# Patient Record
Sex: Female | Born: 1955 | Race: Black or African American | Hispanic: No | Smoking: Never smoker
Health system: Southern US, Community
[De-identification: ages and names within clinical notes are randomized; demographics above are authoritative.]

## PROBLEM LIST (undated history)

## (undated) DIAGNOSIS — E7800 Pure hypercholesterolemia, unspecified: Secondary | ICD-10-CM

## (undated) DIAGNOSIS — I10 Essential (primary) hypertension: Secondary | ICD-10-CM

## (undated) DIAGNOSIS — I639 Cerebral infarction, unspecified: Secondary | ICD-10-CM

## (undated) DIAGNOSIS — D649 Anemia, unspecified: Secondary | ICD-10-CM

## (undated) DIAGNOSIS — R0602 Shortness of breath: Secondary | ICD-10-CM

## (undated) DIAGNOSIS — D573 Sickle-cell trait: Secondary | ICD-10-CM

## (undated) DIAGNOSIS — M1990 Unspecified osteoarthritis, unspecified site: Secondary | ICD-10-CM

## (undated) HISTORY — PX: ANAL FISSURECTOMY: SUR608

---

## 2001-04-10 ENCOUNTER — Inpatient Hospital Stay (HOSPITAL_COMMUNITY): Admission: AD | Admit: 2001-04-10 | Discharge: 2001-04-10 | Payer: Self-pay | Admitting: Obstetrics

## 2001-09-16 ENCOUNTER — Emergency Department (HOSPITAL_COMMUNITY): Admission: EM | Admit: 2001-09-16 | Discharge: 2001-09-16 | Payer: Self-pay | Admitting: Emergency Medicine

## 2001-10-20 ENCOUNTER — Emergency Department (HOSPITAL_COMMUNITY): Admission: EM | Admit: 2001-10-20 | Discharge: 2001-10-20 | Payer: Self-pay | Admitting: Emergency Medicine

## 2004-01-01 ENCOUNTER — Encounter: Admission: RE | Admit: 2004-01-01 | Discharge: 2004-01-01 | Payer: Self-pay | Admitting: Internal Medicine

## 2004-04-14 ENCOUNTER — Emergency Department (HOSPITAL_COMMUNITY): Admission: EM | Admit: 2004-04-14 | Discharge: 2004-04-15 | Payer: Self-pay | Admitting: Emergency Medicine

## 2004-10-02 ENCOUNTER — Emergency Department (HOSPITAL_COMMUNITY): Admission: EM | Admit: 2004-10-02 | Discharge: 2004-10-03 | Payer: Self-pay | Admitting: Emergency Medicine

## 2004-10-17 ENCOUNTER — Ambulatory Visit (HOSPITAL_COMMUNITY): Admission: RE | Admit: 2004-10-17 | Discharge: 2004-10-17 | Payer: Self-pay | Admitting: General Surgery

## 2004-10-17 ENCOUNTER — Ambulatory Visit (HOSPITAL_BASED_OUTPATIENT_CLINIC_OR_DEPARTMENT_OTHER): Admission: RE | Admit: 2004-10-17 | Discharge: 2004-10-17 | Payer: Self-pay | Admitting: General Surgery

## 2005-10-08 ENCOUNTER — Emergency Department (HOSPITAL_COMMUNITY): Admission: EM | Admit: 2005-10-08 | Discharge: 2005-10-08 | Payer: Self-pay | Admitting: Emergency Medicine

## 2005-12-16 ENCOUNTER — Emergency Department (HOSPITAL_COMMUNITY): Admission: EM | Admit: 2005-12-16 | Discharge: 2005-12-16 | Payer: Self-pay | Admitting: Emergency Medicine

## 2006-01-30 ENCOUNTER — Ambulatory Visit (HOSPITAL_COMMUNITY): Admission: RE | Admit: 2006-01-30 | Discharge: 2006-01-30 | Payer: Self-pay | Admitting: Family Medicine

## 2006-10-12 ENCOUNTER — Ambulatory Visit (HOSPITAL_COMMUNITY): Admission: RE | Admit: 2006-10-12 | Discharge: 2006-10-12 | Payer: Self-pay | Admitting: Obstetrics & Gynecology

## 2006-12-30 ENCOUNTER — Emergency Department (HOSPITAL_COMMUNITY): Admission: EM | Admit: 2006-12-30 | Discharge: 2006-12-30 | Payer: Self-pay | Admitting: Family Medicine

## 2007-12-13 ENCOUNTER — Emergency Department (HOSPITAL_COMMUNITY): Admission: EM | Admit: 2007-12-13 | Discharge: 2007-12-13 | Payer: Self-pay | Admitting: Emergency Medicine

## 2008-01-13 ENCOUNTER — Emergency Department (HOSPITAL_COMMUNITY): Admission: EM | Admit: 2008-01-13 | Discharge: 2008-01-13 | Payer: Self-pay | Admitting: Emergency Medicine

## 2008-03-16 ENCOUNTER — Emergency Department (HOSPITAL_COMMUNITY): Admission: EM | Admit: 2008-03-16 | Discharge: 2008-03-16 | Payer: Self-pay | Admitting: Emergency Medicine

## 2008-07-26 ENCOUNTER — Emergency Department (HOSPITAL_COMMUNITY): Admission: EM | Admit: 2008-07-26 | Discharge: 2008-07-26 | Payer: Self-pay | Admitting: Emergency Medicine

## 2010-12-05 ENCOUNTER — Emergency Department (HOSPITAL_COMMUNITY)
Admission: EM | Admit: 2010-12-05 | Discharge: 2010-12-05 | Payer: Self-pay | Source: Home / Self Care | Admitting: Emergency Medicine

## 2010-12-05 LAB — POCT I-STAT, CHEM 8
Creatinine, Ser: 1 mg/dL (ref 0.4–1.2)
Glucose, Bld: 89 mg/dL (ref 70–99)
Hemoglobin: 14.6 g/dL (ref 12.0–15.0)
Sodium: 142 mEq/L (ref 135–145)
TCO2: 30 mmol/L (ref 0–100)

## 2010-12-05 LAB — POCT CARDIAC MARKERS
CKMB, poc: 2.3 ng/mL (ref 1.0–8.0)
Myoglobin, poc: 88.4 ng/mL (ref 12–200)

## 2011-03-24 NOTE — Op Note (Signed)
Rhonda Wells, Rhonda Wells                 ACCOUNT NO.:  [id number]   MEDICAL RECORD NO.:  [id number]          PATIENT TYPE:  AMB   LOCATION:  DSC                          FACILITY:  [facility]   PHYSICIAN:  Sharlet Salina T. Hoxworth, M.D.DATE OF BIRTH:  04/10/1956   DATE OF PROCEDURE:  10/17/2004  DATE OF DISCHARGE:                                 OPERATIVE REPORT   PREOPERATIVE DIAGNOSIS:  Anal fissure.   POSTOPERATIVE DIAGNOSIS:  Anal fissure.   SURGICAL PROCEDURE:  Lateral internal anal sphincterotomy.   SURGEON:  Lorne Skeens. Hoxworth, M.D.   ANESTHESIA:  General.   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  Options for medical and surgical treatment have been  discussed, and she has elected to proceed with lateral internal anal  sphincterotomy.  The nature of the procedure, its indications and expected  recovery and risk of bleeding and infection were discussed and understood.  She is now brought to the operating room for this procedure.   DESCRIPTION OF OPERATION:  The patient was brought to the operating room,  placed in the supine position on the operating table and general anesthesia  was induced.  She was carefully positioned in stirrups in the lithotomy  position and the perineum sterilely prepped and draped.  A bullet retractor  was placed and examination revealed the again noted posterior midline  fissure and significant hypertrophy of the internal anal sphincter.  The  intersphincteric groove was easily palpable and a small incision was made in  the anoderm at the 3 o'clock position and a hemostat inserted into the  intersphincteric groove and the hypertrophied internal anal sphincter  elevated up into the incision and divided with cautery.  The perianal area  was infiltrated with  Marcaine with epinephrine.  Hemostasis was obtained  with pressure.  A dry gauze dressing was applied and the patient taken to  recovery in good condition.      Benj   BTH/MEDQ  D:  10/17/2004  T:  10/17/2004  Job:  [id number]

## 2012-10-16 ENCOUNTER — Encounter (HOSPITAL_COMMUNITY): Payer: Self-pay | Admitting: *Deleted

## 2012-10-16 ENCOUNTER — Emergency Department (INDEPENDENT_AMBULATORY_CARE_PROVIDER_SITE_OTHER)
Admission: EM | Admit: 2012-10-16 | Discharge: 2012-10-16 | Disposition: A | Discharge status: Home / Self Care | Payer: Managed Care, Other (non HMO) | Source: Home / Self Care | Attending: Family Medicine | Admitting: Family Medicine

## 2012-10-16 DIAGNOSIS — J069 Acute upper respiratory infection, unspecified: Secondary | ICD-10-CM

## 2012-10-16 DIAGNOSIS — H6692 Otitis media, unspecified, left ear: Secondary | ICD-10-CM

## 2012-10-16 DIAGNOSIS — H6690 Otitis media, unspecified, unspecified ear: Secondary | ICD-10-CM

## 2012-10-16 HISTORY — DX: Essential (primary) hypertension: I10

## 2012-10-16 MED ORDER — BENZONATATE 100 MG PO CAPS: 100.0000 mg | ORAL_CAPSULE | Freq: Three times a day (TID) | ORAL | Status: DC

## 2012-10-16 MED ORDER — AMOXICILLIN 500 MG PO CAPS: 500.0000 mg | ORAL_CAPSULE | Freq: Three times a day (TID) | ORAL | Status: DC

## 2012-10-16 MED ORDER — CETIRIZINE HCL 10 MG PO CAPS: 1.0000 | ORAL_CAPSULE | Freq: Every day | ORAL | Status: DC

## 2012-10-16 MED ORDER — GUAIFENESIN-CODEINE 100-10 MG/5ML PO SYRP: 5.0000 mL | ORAL_SOLUTION | Freq: Three times a day (TID) | ORAL | Status: DC | PRN

## 2012-10-16 NOTE — ED Notes (Signed)
Pt  Reports   Symptoms  Of  Cough    l  Earache   And     Headache         Symptoms  X  2  Days   -  She  Reports  She  Felt a      Pulling  Sensation  l    Side     Underneath  The  l  Ear       After  Drinking  Juice           She  Is  Sitting  Upright on  Exam table  Speaking in  Complete  sentances

## 2012-10-17 NOTE — ED Provider Notes (Signed)
History     CSN: [id number]  Arrival date & time 10/16/12  1004   First MD Initiated Contact with Patient 10/16/12 1031      Chief Complaint  Patient presents with  . Cough    (Consider location/radiation/quality/duration/timing/severity/associated sxs/prior treatment) HPI Comments: 56 y/o female no smoker here c/o non productive cough and nasal congestion for about 5 days also c/o pain left ear pain and headache for last 2 days. Denies fever. appetite ok. Energy level ok. Denies difficulty breathing or pleuritic type of chest pain. She works in a day care.    Past Medical History  Diagnosis Date  . Hypertension     History reviewed. No pertinent past surgical history.  No family history on file.  History  Substance Use Topics  . Smoking status: Never Smoker   . Smokeless tobacco: Not on file  . Alcohol Use: No    OB History    Grav Para Term Preterm Abortions TAB SAB Ect Mult Living                  Review of Systems  Constitutional: Negative for fever, chills and appetite change.  HENT: Positive for ear pain, congestion and rhinorrhea. Negative for sore throat.   Eyes: Negative for discharge.  Respiratory: Positive for cough. Negative for shortness of breath and wheezing.   Gastrointestinal: Negative for nausea and vomiting.  Skin: Negative for rash.  Neurological: Positive for headaches. Negative for dizziness.  All other systems reviewed and are negative.    Allergies  Review of patient's allergies indicates no known allergies.  Home Medications   Current Outpatient Rx  Name  Route  Sig  Dispense  Refill  . BAYER ASPIRIN PO   Oral   Take by mouth.         Marland Kitchen DYAZIDE PO   Oral   Take by mouth.         . AMOXICILLIN 500 MG PO CAPS   Oral   Take 1 capsule (500 mg total) by mouth 3 (three) times daily.   21 capsule   0   . BENZONATATE 100 MG PO CAPS   Oral   Take 1 capsule (100 mg total) by mouth every 8 (eight) hours.   21 capsule    0   . CETIRIZINE HCL 10 MG PO CAPS   Oral   Take 1 capsule (10 mg total) by mouth daily.   30 capsule   0   . GUAIFENESIN-CODEINE 100-10 MG/5ML PO SYRP   Oral   Take 5 mLs by mouth 3 (three) times daily as needed for cough.   120 mL   0     BP 126/86  Pulse 105  Temp 98.2 F (36.8 C) (Oral)  Resp 14  SpO2 100%  Physical Exam  Nursing note and vitals reviewed. Constitutional: She is oriented to person, place, and time. She appears well-developed and well-nourished. No distress.  HENT:  Head: Normocephalic and atraumatic.       Nasal Congestion with erythema and swelling of nasal turbinates, clear rhinorrhea. Significant pharyngeal erythema no exudates. No uvula deviation. No trismus. Left TM's with erythema and swelling. Right TM increased vascular markings and some dullness but no swelling or bulging   Eyes: Conjunctivae normal are normal. Right eye exhibits no discharge. Left eye exhibits no discharge.  Neck: Neck supple.  Cardiovascular: Normal rate, regular rhythm and normal heart sounds.   Pulmonary/Chest: Effort normal and breath sounds normal. No respiratory distress.  She has no wheezes. She has no rales. She exhibits no tenderness.  Lymphadenopathy:    She has no cervical adenopathy.  Neurological: She is alert and oriented to person, place, and time.  Skin: No rash noted. She is not diaphoretic.    ED Course  Procedures (including critical care time)  Labs Reviewed - No data to display No results found.   1. Left otitis media   2. URI (upper respiratory infection)       MDM  Treated with amoxicillin, tessalon pearls, cetirizine, guaifenesin w/ codeine. Supportive care and red flags that should prompt her return to medical attention discussed with patient and provided in writing.         Sharin Grave, MD 10/17/12 [phone]

## 2013-03-28 ENCOUNTER — Other Ambulatory Visit: Payer: Self-pay | Admitting: Orthopedic Surgery

## 2013-03-28 ENCOUNTER — Other Ambulatory Visit (HOSPITAL_COMMUNITY): Payer: Self-pay

## 2013-04-15 ENCOUNTER — Encounter (HOSPITAL_COMMUNITY): Payer: Self-pay | Admitting: Pharmacy Technician

## 2013-04-16 ENCOUNTER — Other Ambulatory Visit: Payer: Self-pay | Admitting: Orthopedic Surgery

## 2013-04-16 ENCOUNTER — Encounter (HOSPITAL_COMMUNITY)
Admission: RE | Admit: 2013-04-16 | Discharge: 2013-04-16 | Disposition: A | Discharge status: Home / Self Care | Payer: No Typology Code available for payment source | Source: Ambulatory Visit | Attending: Orthopedic Surgery | Admitting: Orthopedic Surgery

## 2013-04-16 ENCOUNTER — Encounter (HOSPITAL_COMMUNITY): Payer: Self-pay

## 2013-04-16 ENCOUNTER — Inpatient Hospital Stay (HOSPITAL_COMMUNITY): Admission: RE | Admit: 2013-04-16 | Payer: Managed Care, Other (non HMO) | Source: Ambulatory Visit

## 2013-04-16 HISTORY — DX: Unspecified osteoarthritis, unspecified site: M19.90

## 2013-04-16 HISTORY — DX: Anemia, unspecified: D64.9

## 2013-04-16 HISTORY — DX: Cerebral infarction, unspecified: I63.9

## 2013-04-16 HISTORY — DX: Shortness of breath: R06.02

## 2013-04-16 LAB — URINE MICROSCOPIC-ADD ON

## 2013-04-16 LAB — URINALYSIS, ROUTINE W REFLEX MICROSCOPIC
Bilirubin Urine: NEGATIVE
Hgb urine dipstick: NEGATIVE
Ketones, ur: NEGATIVE mg/dL
Nitrite: NEGATIVE
Urobilinogen, UA: 0.2 mg/dL (ref 0.0–1.0)
pH: 6 (ref 5.0–8.0)

## 2013-04-16 LAB — BASIC METABOLIC PANEL
Calcium: 9.8 mg/dL (ref 8.4–10.5)
Creatinine, Ser: 0.81 mg/dL (ref 0.50–1.10)
GFR calc Af Amer: >90 mL/min (ref >90)

## 2013-04-16 LAB — SURGICAL PCR SCREEN
MRSA, PCR: NEGATIVE
Staphylococcus aureus: NEGATIVE

## 2013-04-16 LAB — APTT: aPTT: 33 seconds (ref 24–37)

## 2013-04-16 LAB — ABO/RH: ABO/RH(D): O POS

## 2013-04-16 LAB — TYPE AND SCREEN: Antibody Screen: NEGATIVE

## 2013-04-16 LAB — CBC
MCHC: 34.2 g/dL (ref 30.0–36.0)
Platelets: 224 10*3/uL (ref 150–400)
RDW: 13 % (ref 11.5–15.5)
WBC: 3.3 10*3/uL — ABNORMAL LOW (ref 4.0–10.5)

## 2013-04-16 NOTE — Pre-Procedure Instructions (Addendum)
BREONNA GAFFORD  04/16/2013   Your procedure is scheduled on:  04/22/13  Report to [facility] Stay [facility] at530AM.  Call this number if you have problems the morning of surgery: [phone]   Remember:   Do not eat food or drink liquids after midnight.   Take these medicines the morning of surgery with A SIP OF WATER: none STOP aspirin, garlic, curamin 04/17/13   Do not wear jewelry, make-up or nail polish.  Do not wear lotions, powders, or perfumes. You may wear deodorant.  Do not shave 48 hours prior to surgery. Men may shave face and neck.  Do not bring valuables to the hospital.  [facility] is not responsible                   for any belongings or valuables.  Contacts, dentures or bridgework may not be worn into surgery.  Leave suitcase in the car. After surgery it may be brought to your room.  For patients admitted to the hospital, checkout time is 11:00 AM the day of  discharge.   Patients discharged the day of surgery will not be allowed to drive  home.  Name and phone number of your driver:   Special Instructions: Shower using CHG 2 nights before surgery and the night before surgery.  If you shower the day of surgery use CHG.  Use special wash - you have one bottle of CHG for all showers.  You should use approximately 1/3 of the bottle for each shower.   Please read over the following fact sheets that you were given: Pain Booklet, Coughing and Deep Breathing, Blood Transfusion Information, Total Joint Packet, MRSA Information and Surgical Site Infection Prevention

## 2013-04-17 DIAGNOSIS — E785 Hyperlipidemia, unspecified: Secondary | ICD-10-CM | POA: Insufficient documentation

## 2013-04-17 LAB — URINE CULTURE

## 2013-04-21 MED ORDER — CEFAZOLIN SODIUM-DEXTROSE 2-3 GM-% IV SOLR
2.0000 g | INTRAVENOUS | Status: AC
  Administered 2013-04-22: 2 g via INTRAVENOUS
  Filled 2013-04-21: qty 50

## 2013-04-22 ENCOUNTER — Encounter (HOSPITAL_COMMUNITY): Payer: Self-pay | Admitting: *Deleted

## 2013-04-22 ENCOUNTER — Encounter (HOSPITAL_COMMUNITY): Payer: Self-pay | Admitting: Anesthesiology

## 2013-04-22 ENCOUNTER — Encounter (HOSPITAL_COMMUNITY)
Admission: RE | Disposition: A | Discharge status: Home Health | Payer: Self-pay | Source: Ambulatory Visit | Attending: Orthopedic Surgery

## 2013-04-22 ENCOUNTER — Ambulatory Visit (HOSPITAL_COMMUNITY): Payer: No Typology Code available for payment source | Admitting: Anesthesiology

## 2013-04-22 ENCOUNTER — Inpatient Hospital Stay (HOSPITAL_COMMUNITY)
Admission: RE | Admit: 2013-04-22 | Discharge: 2013-04-25 | DRG: 470 | Disposition: A | Discharge status: Home Health | Payer: No Typology Code available for payment source | Source: Ambulatory Visit | Attending: Orthopedic Surgery | Admitting: Orthopedic Surgery

## 2013-04-22 DIAGNOSIS — Z8673 Personal history of transient ischemic attack (TIA), and cerebral infarction without residual deficits: Secondary | ICD-10-CM

## 2013-04-22 DIAGNOSIS — Z01812 Encounter for preprocedural laboratory examination: Secondary | ICD-10-CM

## 2013-04-22 DIAGNOSIS — Z7982 Long term (current) use of aspirin: Secondary | ICD-10-CM

## 2013-04-22 DIAGNOSIS — Z7901 Long term (current) use of anticoagulants: Secondary | ICD-10-CM

## 2013-04-22 DIAGNOSIS — M1710 Unilateral primary osteoarthritis, unspecified knee: Principal | ICD-10-CM | POA: Diagnosis present

## 2013-04-22 DIAGNOSIS — I10 Essential (primary) hypertension: Secondary | ICD-10-CM | POA: Diagnosis present

## 2013-04-22 DIAGNOSIS — Z79899 Other long term (current) drug therapy: Secondary | ICD-10-CM

## 2013-04-22 HISTORY — PX: TOTAL KNEE ARTHROPLASTY: SHX125

## 2013-04-22 HISTORY — DX: Sickle-cell trait: D57.3

## 2013-04-22 HISTORY — DX: Pure hypercholesterolemia, unspecified: E78.00

## 2013-04-22 SURGERY — ARTHROPLASTY, KNEE, TOTAL
Anesthesia: General | Site: Knee | Laterality: Right | Wound class: Clean

## 2013-04-22 MED ORDER — METHOCARBAMOL 500 MG PO TABS
500.0000 mg | ORAL_TABLET | Freq: Four times a day (QID) | ORAL | Status: DC | PRN
  Administered 2013-04-22 – 2013-04-25 (×6): 500 mg via ORAL
  Filled 2013-04-22 (×6): qty 1

## 2013-04-22 MED ORDER — CHLORHEXIDINE GLUCONATE 4 % EX LIQD: 60.0000 mL | Freq: Once | CUTANEOUS | Status: DC

## 2013-04-22 MED ORDER — MAGNESIUM CITRATE PO SOLN: 0.5000 | Freq: Every day | ORAL | Status: DC | PRN

## 2013-04-22 MED ORDER — MORPHINE SULFATE 4 MG/ML IJ SOLN
INTRAMUSCULAR | Status: AC
  Filled 2013-04-22: qty 1

## 2013-04-22 MED ORDER — CHOLECALCIFEROL 10 MCG (400 UNIT) PO TABS
400.0000 [IU] | ORAL_TABLET | Freq: Every day | ORAL | Status: DC
  Administered 2013-04-22 – 2013-04-25 (×4): 400 [IU] via ORAL
  Filled 2013-04-22 (×4): qty 1

## 2013-04-22 MED ORDER — VITAMIN D3 10 MCG (400 UNIT) PO CAPS: 1.0000 | ORAL_CAPSULE | Freq: Every day | ORAL | Status: DC

## 2013-04-22 MED ORDER — BUPIVACAINE HCL (PF) 0.25 % IJ SOLN
INTRAMUSCULAR | Status: AC
  Filled 2013-04-22: qty 30

## 2013-04-22 MED ORDER — BUPIVACAINE HCL (PF) 0.25 % IJ SOLN
INTRAMUSCULAR | Status: DC | PRN
  Administered 2013-04-22: 20 mL via INTRA_ARTICULAR

## 2013-04-22 MED ORDER — OXYCODONE HCL 5 MG/5ML PO SOLN: 5.0000 mg | Freq: Once | ORAL | Status: DC | PRN

## 2013-04-22 MED ORDER — TRIAMTERENE-HCTZ 75-50 MG PO TABS
0.5000 | ORAL_TABLET | Freq: Every day | ORAL | Status: DC
  Administered 2013-04-22 – 2013-04-25 (×4): 0.5 via ORAL
  Filled 2013-04-22 (×4): qty 0.5

## 2013-04-22 MED ORDER — PHENOL 1.4 % MT LIQD: 1.0000 | OROMUCOSAL | Status: DC | PRN

## 2013-04-22 MED ORDER — OXYCODONE HCL 5 MG PO TABS: 5.0000 mg | ORAL_TABLET | Freq: Once | ORAL | Status: DC | PRN

## 2013-04-22 MED ORDER — ARTIFICIAL TEARS OP OINT
TOPICAL_OINTMENT | OPHTHALMIC | Status: DC | PRN
  Administered 2013-04-22: 1 via OPHTHALMIC

## 2013-04-22 MED ORDER — ROCURONIUM BROMIDE 100 MG/10ML IV SOLN
INTRAVENOUS | Status: DC | PRN
  Administered 2013-04-22: 50 mg via INTRAVENOUS

## 2013-04-22 MED ORDER — HYDROMORPHONE HCL PF 1 MG/ML IJ SOLN
0.2500 mg | INTRAMUSCULAR | Status: DC | PRN
  Administered 2013-04-22: 0.5 mg via INTRAVENOUS

## 2013-04-22 MED ORDER — ONDANSETRON HCL 4 MG/2ML IJ SOLN: 4.0000 mg | Freq: Four times a day (QID) | INTRAMUSCULAR | Status: DC | PRN

## 2013-04-22 MED ORDER — METOCLOPRAMIDE HCL 5 MG/ML IJ SOLN: 5.0000 mg | Freq: Three times a day (TID) | INTRAMUSCULAR | Status: DC | PRN

## 2013-04-22 MED ORDER — MORPHINE SULFATE 2 MG/ML IJ SOLN
2.0000 mg | INTRAMUSCULAR | Status: DC | PRN
  Administered 2013-04-23: 2 mg via INTRAVENOUS
  Filled 2013-04-22: qty 1

## 2013-04-22 MED ORDER — CLONIDINE HCL (ANALGESIA) 100 MCG/ML EP SOLN
EPIDURAL | Status: DC | PRN
  Administered 2013-04-22: .75 mL via INTRA_ARTICULAR

## 2013-04-22 MED ORDER — GLYCOPYRROLATE 0.2 MG/ML IJ SOLN
INTRAMUSCULAR | Status: DC | PRN
  Administered 2013-04-22: 0.6 mg via INTRAVENOUS

## 2013-04-22 MED ORDER — WARFARIN - PHARMACIST DOSING INPATIENT: Freq: Every day | Status: DC

## 2013-04-22 MED ORDER — CEFAZOLIN SODIUM 1-5 GM-% IV SOLN
1.0000 g | Freq: Four times a day (QID) | INTRAVENOUS | Status: AC
  Administered 2013-04-22: 1 g via INTRAVENOUS
  Filled 2013-04-22 (×2): qty 50

## 2013-04-22 MED ORDER — ONDANSETRON HCL 4 MG PO TABS: 4.0000 mg | ORAL_TABLET | Freq: Four times a day (QID) | ORAL | Status: DC | PRN

## 2013-04-22 MED ORDER — METHOCARBAMOL 100 MG/ML IJ SOLN
500.0000 mg | Freq: Four times a day (QID) | INTRAVENOUS | Status: DC | PRN
  Filled 2013-04-22: qty 5

## 2013-04-22 MED ORDER — PROMETHAZINE HCL 25 MG/ML IJ SOLN
INTRAMUSCULAR | Status: AC
  Filled 2013-04-22: qty 1

## 2013-04-22 MED ORDER — OXYCODONE HCL 5 MG PO TABS
5.0000 mg | ORAL_TABLET | ORAL | Status: DC | PRN
  Administered 2013-04-23 – 2013-04-25 (×9): 10 mg via ORAL
  Filled 2013-04-22 (×9): qty 2

## 2013-04-22 MED ORDER — PROPOFOL 10 MG/ML IV BOLUS
INTRAVENOUS | Status: DC | PRN
  Administered 2013-04-22: 110 mg via INTRAVENOUS

## 2013-04-22 MED ORDER — NALOXONE HCL 0.4 MG/ML IJ SOLN: 0.4000 mg | INTRAMUSCULAR | Status: DC | PRN

## 2013-04-22 MED ORDER — DIPHENHYDRAMINE HCL 12.5 MG/5ML PO ELIX: 12.5000 mg | ORAL_SOLUTION | Freq: Four times a day (QID) | ORAL | Status: DC | PRN

## 2013-04-22 MED ORDER — WARFARIN SODIUM 5 MG PO TABS
5.0000 mg | ORAL_TABLET | Freq: Once | ORAL | Status: AC
  Administered 2013-04-22: 5 mg via ORAL
  Filled 2013-04-22: qty 1

## 2013-04-22 MED ORDER — METOCLOPRAMIDE HCL 10 MG PO TABS: 5.0000 mg | ORAL_TABLET | Freq: Three times a day (TID) | ORAL | Status: DC | PRN

## 2013-04-22 MED ORDER — LORATADINE 10 MG PO TABS
10.0000 mg | ORAL_TABLET | Freq: Every day | ORAL | Status: DC | PRN
  Filled 2013-04-22: qty 1

## 2013-04-22 MED ORDER — LACTATED RINGERS IV SOLN
INTRAVENOUS | Status: DC | PRN
  Administered 2013-04-22 (×2): via INTRAVENOUS

## 2013-04-22 MED ORDER — MORPHINE SULFATE (PF) 1 MG/ML IV SOLN
INTRAVENOUS | Status: DC
  Administered 2013-04-22: 1.5 mg via INTRAVENOUS
  Administered 2013-04-22: 5.47 mg via INTRAVENOUS
  Administered 2013-04-23: 9 mg via INTRAVENOUS
  Administered 2013-04-23: 04:00:00 via INTRAVENOUS
  Filled 2013-04-22 (×2): qty 25

## 2013-04-22 MED ORDER — MORPHINE SULFATE 4 MG/ML IJ SOLN
INTRAMUSCULAR | Status: DC | PRN
  Administered 2013-04-22: 4 mg

## 2013-04-22 MED ORDER — SODIUM CHLORIDE 0.9 % IR SOLN
Status: DC | PRN
  Administered 2013-04-22: 3000 mL

## 2013-04-22 MED ORDER — PROMETHAZINE HCL 25 MG/ML IJ SOLN
6.2500 mg | Freq: Four times a day (QID) | INTRAMUSCULAR | Status: AC | PRN
  Administered 2013-04-22: 6.25 mg via INTRAVENOUS

## 2013-04-22 MED ORDER — DIPHENHYDRAMINE HCL 50 MG/ML IJ SOLN: 12.5000 mg | Freq: Four times a day (QID) | INTRAMUSCULAR | Status: DC | PRN

## 2013-04-22 MED ORDER — SODIUM CHLORIDE 0.9 % IJ SOLN: 9.0000 mL | INTRAMUSCULAR | Status: DC | PRN

## 2013-04-22 MED ORDER — MENTHOL 3 MG MT LOZG: 1.0000 | LOZENGE | OROMUCOSAL | Status: DC | PRN

## 2013-04-22 MED ORDER — FENTANYL CITRATE 0.05 MG/ML IJ SOLN
INTRAMUSCULAR | Status: DC | PRN
  Administered 2013-04-22: 25 ug via INTRAVENOUS
  Administered 2013-04-22: 200 ug via INTRAVENOUS
  Administered 2013-04-22 (×3): 25 ug via INTRAVENOUS

## 2013-04-22 MED ORDER — MIDAZOLAM HCL 5 MG/5ML IJ SOLN
INTRAMUSCULAR | Status: DC | PRN
  Administered 2013-04-22: 0.5 mg via INTRAVENOUS

## 2013-04-22 MED ORDER — NEOSTIGMINE METHYLSULFATE 1 MG/ML IJ SOLN
INTRAMUSCULAR | Status: DC | PRN
  Administered 2013-04-22: 4 mg via INTRAVENOUS

## 2013-04-22 MED ORDER — ONDANSETRON HCL 4 MG/2ML IJ SOLN
INTRAMUSCULAR | Status: DC | PRN
  Administered 2013-04-22: 4 mg via INTRAVENOUS

## 2013-04-22 MED ORDER — WARFARIN VIDEO: Freq: Once | Status: DC

## 2013-04-22 MED ORDER — MORPHINE SULFATE (PF) 1 MG/ML IV SOLN
INTRAVENOUS | Status: AC
  Filled 2013-04-22: qty 25

## 2013-04-22 MED ORDER — ACETAMINOPHEN 325 MG PO TABS: 650.0000 mg | ORAL_TABLET | Freq: Four times a day (QID) | ORAL | Status: DC | PRN

## 2013-04-22 MED ORDER — DOCUSATE SODIUM 100 MG PO CAPS
100.0000 mg | ORAL_CAPSULE | Freq: Two times a day (BID) | ORAL | Status: DC
  Administered 2013-04-22 – 2013-04-25 (×6): 100 mg via ORAL
  Filled 2013-04-22 (×8): qty 1

## 2013-04-22 MED ORDER — ACETAMINOPHEN 650 MG RE SUPP: 650.0000 mg | Freq: Four times a day (QID) | RECTAL | Status: DC | PRN

## 2013-04-22 MED ORDER — DEXTROSE 5 % IV SOLN
INTRAVENOUS | Status: DC | PRN
  Administered 2013-04-22: 08:00:00 via INTRAVENOUS

## 2013-04-22 MED ORDER — LIDOCAINE HCL (CARDIAC) 20 MG/ML IV SOLN
INTRAVENOUS | Status: DC | PRN
  Administered 2013-04-22: 40 mg via INTRAVENOUS

## 2013-04-22 MED ORDER — COUMADIN BOOK
Freq: Once | Status: DC
  Filled 2013-04-22: qty 1

## 2013-04-22 MED ORDER — HYDROMORPHONE HCL PF 1 MG/ML IJ SOLN
INTRAMUSCULAR | Status: AC
  Filled 2013-04-22: qty 1

## 2013-04-22 MED ORDER — POTASSIUM CHLORIDE IN NACL 20-0.9 MEQ/L-% IV SOLN
INTRAVENOUS | Status: AC
  Administered 2013-04-23: 01:00:00 via INTRAVENOUS
  Filled 2013-04-22 (×2): qty 1000

## 2013-04-22 MED ORDER — ASPIRIN 81 MG PO CHEW
81.0000 mg | CHEWABLE_TABLET | Freq: Every day | ORAL | Status: DC
  Administered 2013-04-23 – 2013-04-25 (×3): 81 mg via ORAL
  Filled 2013-04-22 (×3): qty 1

## 2013-04-22 MED ORDER — CLONIDINE HCL (ANALGESIA) 100 MCG/ML EP SOLN
150.0000 ug | Freq: Once | EPIDURAL | Status: DC
  Filled 2013-04-22: qty 1.5

## 2013-04-22 SURGICAL SUPPLY — 78 items
BANDAGE ELASTIC 4 VELCRO ST LF (GAUZE/BANDAGES/DRESSINGS) ×2 IMPLANT
BANDAGE ELASTIC 6 VELCRO ST LF (GAUZE/BANDAGES/DRESSINGS) ×1 IMPLANT
BANDAGE ESMARK 6X9 LF (GAUZE/BANDAGES/DRESSINGS) ×1 IMPLANT
BLADE SAG 18X100X1.27 (BLADE) ×2 IMPLANT
BLADE SAW SGTL 13.0X1.19X90.0M (BLADE) ×2 IMPLANT
BNDG CMPR 9X6 STRL LF SNTH (GAUZE/BANDAGES/DRESSINGS) ×1
BNDG CMPR MED 10X6 ELC LF (GAUZE/BANDAGES/DRESSINGS) ×3
BNDG COHESIVE 6X5 TAN STRL LF (GAUZE/BANDAGES/DRESSINGS) ×2 IMPLANT
BNDG ELASTIC 6X10 VLCR STRL LF (GAUZE/BANDAGES/DRESSINGS) ×6 IMPLANT
BNDG ESMARK 6X9 LF (GAUZE/BANDAGES/DRESSINGS) ×2
BOWL SMART MIX CTS (DISPOSABLE) ×2 IMPLANT
CEMENT BONE SIMPLEX SPEEDSET (Cement) ×2 IMPLANT
CLOTH BEACON ORANGE TIMEOUT ST (SAFETY) ×2 IMPLANT
COVER SURGICAL LIGHT HANDLE (MISCELLANEOUS) ×2 IMPLANT
CUFF TOURNIQUET SINGLE 34IN LL (TOURNIQUET CUFF) IMPLANT
CUFF TOURNIQUET SINGLE 44IN (TOURNIQUET CUFF) IMPLANT
DRAPE INCISE IOBAN 66X45 STRL (DRAPES) IMPLANT
DRAPE ORTHO SPLIT 77X108 STRL (DRAPES) ×6
DRAPE SURG ORHT 6 SPLT 77X108 (DRAPES) ×3 IMPLANT
DRAPE U-SHAPE 47X51 STRL (DRAPES) ×2 IMPLANT
DRAPE X-RAY CASS 24X20 (DRAPES) IMPLANT
DRSG PAD ABDOMINAL 8X10 ST (GAUZE/BANDAGES/DRESSINGS) ×3 IMPLANT
DURAPREP 26ML APPLICATOR (WOUND CARE) ×2 IMPLANT
ELECT REM PT RETURN 9FT ADLT (ELECTROSURGICAL) ×2
ELECTRODE REM PT RTRN 9FT ADLT (ELECTROSURGICAL) ×1 IMPLANT
EVACUATOR 1/8 PVC DRAIN (DRAIN) ×2 IMPLANT
FACESHIELD LNG OPTICON STERILE (SAFETY) ×2 IMPLANT
GAUZE XEROFORM 5X9 LF (GAUZE/BANDAGES/DRESSINGS) ×2 IMPLANT
GLOVE BIOGEL PI IND STRL 7.5 (GLOVE) ×1 IMPLANT
GLOVE BIOGEL PI IND STRL 8 (GLOVE) ×1 IMPLANT
GLOVE BIOGEL PI INDICATOR 7.5 (GLOVE) ×1
GLOVE BIOGEL PI INDICATOR 8 (GLOVE) ×1
GLOVE ECLIPSE 7.0 STRL STRAW (GLOVE) ×4 IMPLANT
GLOVE SURG ORTHO 8.0 STRL STRW (GLOVE) ×2 IMPLANT
GOWN PREVENTION PLUS LG XLONG (DISPOSABLE) IMPLANT
GOWN PREVENTION PLUS XLARGE (GOWN DISPOSABLE) ×2 IMPLANT
GOWN STRL NON-REIN LRG LVL3 (GOWN DISPOSABLE) ×6 IMPLANT
HANDPIECE INTERPULSE COAX TIP (DISPOSABLE) ×2
HOOD PEEL AWAY FACE SHEILD DIS (HOOD) ×6 IMPLANT
IMMOBILIZER KNEE 20 (SOFTGOODS)
IMMOBILIZER KNEE 20 THIGH 36 (SOFTGOODS) IMPLANT
IMMOBILIZER KNEE 22 UNIV (SOFTGOODS) IMPLANT
IMMOBILIZER KNEE 24 THIGH 36 (MISCELLANEOUS) IMPLANT
IMMOBILIZER KNEE 24 UNIV (MISCELLANEOUS)
KIT BASIN OR (CUSTOM PROCEDURE TRAY) ×2 IMPLANT
KIT ROOM TURNOVER OR (KITS) ×2 IMPLANT
KNEE/VIT E POLY LINER LEVEL 1B ×1 IMPLANT
MANIFOLD NEPTUNE II (INSTRUMENTS) ×2 IMPLANT
MARKER SPHERE PSV REFLC THRD 5 (MARKER) IMPLANT
NDL 18GX1X1/2 (RX/OR ONLY) (NEEDLE) ×1 IMPLANT
NDL SPNL 18GX3.5 QUINCKE PK (NEEDLE) ×1 IMPLANT
NEEDLE 18GX1X1/2 (RX/OR ONLY) (NEEDLE) ×2 IMPLANT
NEEDLE SPNL 18GX3.5 QUINCKE PK (NEEDLE) ×2 IMPLANT
NS IRRIG 1000ML POUR BTL (IV SOLUTION) ×2 IMPLANT
PACK TOTAL JOINT (CUSTOM PROCEDURE TRAY) ×2 IMPLANT
PAD ARMBOARD 7.5X6 YLW CONV (MISCELLANEOUS) ×4 IMPLANT
PAD CAST 4YDX4 CTTN HI CHSV (CAST SUPPLIES) ×1 IMPLANT
PADDING CAST COTTON 4X4 STRL (CAST SUPPLIES) ×2
PADDING CAST COTTON 6X4 STRL (CAST SUPPLIES) ×4 IMPLANT
PIN SCHANZ 4MM 130MM (PIN) IMPLANT
RUBBERBAND STERILE (MISCELLANEOUS) ×2 IMPLANT
SET HNDPC FAN SPRY TIP SCT (DISPOSABLE) ×1 IMPLANT
SPONGE GAUZE 4X4 12PLY (GAUZE/BANDAGES/DRESSINGS) ×2 IMPLANT
SPONGE LAP 18X18 X RAY DECT (DISPOSABLE) IMPLANT
STAPLER VISISTAT 35W (STAPLE) ×2 IMPLANT
SUCTION FRAZIER TIP 10 FR DISP (SUCTIONS) ×2 IMPLANT
SUT ETHILON 3 0 PS 1 (SUTURE) ×4 IMPLANT
SUT VIC AB 0 CTB1 27 (SUTURE) ×6 IMPLANT
SUT VIC AB 1 CT1 27 (SUTURE) ×10
SUT VIC AB 1 CT1 27XBRD ANBCTR (SUTURE) ×5 IMPLANT
SUT VIC AB 2-0 CT1 27 (SUTURE) ×4
SUT VIC AB 2-0 CT1 TAPERPNT 27 (SUTURE) ×2 IMPLANT
SYR 30ML SLIP (SYRINGE) ×2 IMPLANT
SYR TB 1ML LUER SLIP (SYRINGE) ×2 IMPLANT
TOWEL OR 17X24 6PK STRL BLUE (TOWEL DISPOSABLE) ×2 IMPLANT
TOWEL OR 17X26 10 PK STRL BLUE (TOWEL DISPOSABLE) ×4 IMPLANT
TRAY FOLEY CATH 14FR (SET/KITS/TRAYS/PACK) ×2 IMPLANT
WATER STERILE IRR 1000ML POUR (IV SOLUTION) ×4 IMPLANT

## 2013-04-22 NOTE — Progress Notes (Signed)
ANTICOAGULATION CONSULT NOTE - Initial Consult  Pharmacy Consult for  Coumadin Indication: VTE prophylaxis  No Known Allergies  Patient Measurements:  Wt: 67.3 kg  Vital Signs: Temp: 97.8 F (36.6 C) (06/17 1300) Temp src: Oral (06/17 0602) BP: 134/80 mmHg (06/17 1300) Pulse Rate: 69 (06/17 1300)  Labs: No results found for this basename: HGB, HCT, PLT, APTT, LABPROT, INR, HEPARINUNFRC, CREATININE, CKTOTAL, CKMB, TROPONINI,  in the last 72 hours  CrCl is unknown because there is no height on file for the current visit.   Medical History: Past Medical History  Diagnosis Date  . Hypertension   . Shortness of breath     hx fluid in lungs  20's  . Stroke     tia 10 -15 yrs ago  . Arthritis   . Anemia     Medications:  Prescriptions prior to admission  Medication Sig Dispense Refill  . aspirin 81 MG chewable tablet Chew 81 mg by mouth daily.      . Cholecalciferol (VITAMIN D3) 400 UNITS CAPS Take 1 capsule by mouth daily.      . ferrous sulfate 325 (65 FE) MG tablet Take 325 mg by mouth daily with breakfast.      . Garlic 1000 MG CAPS Take 1 capsule by mouth daily.      Marland Kitchen loratadine (CLARITIN) 10 MG tablet Take 10 mg by mouth daily as needed for allergies. For allergy symptoms      . magnesium citrate SOLN Take 0.5 Bottles by mouth daily as needed. For constipation      . traMADol (ULTRAM) 50 MG tablet Take 50 mg by mouth every 6 (six) hours as needed for pain.      Marland Kitchen triamterene-hydrochlorothiazide (MAXZIDE) 75-50 MG per tablet Take 0.5 tablets by mouth daily.      Marland Kitchen OVER THE COUNTER MEDICATION Take 1 tablet by mouth daily as needed. Medication-Curamin As needed for arthritis pain        Assessment: 57 yo F with osteoarthritis of R knee, now s/p R TKA, to start Coumadin for DVT prophylaxis.  Baseline INR 6/11 noted to be WNL.  Warfarin points score=3.  Goal of Therapy:  INR 2-3 Monitor platelets by anticoagulation protocol: Yes   Plan:  - Coumadin 5 mg po x1  tonight - Daily INR (ordered) - Initiate education with book/video  Jill Side L. Illene Bolus, PharmD, BCPS Clinical Pharmacist Pager: [phone] Pharmacy: [phone] 04/22/2013 2:25 PM

## 2013-04-22 NOTE — Anesthesia Postprocedure Evaluation (Signed)
  Anesthesia Post-op Note  Patient: [name]  Procedure(s) Performed: Procedure(s) with comments: TOTAL KNEE ARTHROPLASTY (Right) - Right Total Knee Arthroplasty  Patient Location: PACU  Anesthesia Type:General  Level of Consciousness: awake and alert   Airway and Oxygen Therapy: Patient Spontanous Breathing and Patient connected to nasal cannula oxygen  Post-op Pain: mild  Post-op Assessment: Post-op Vital signs reviewed, Patient's Cardiovascular Status Stable, Respiratory Function Stable, Patent Airway and No signs of Nausea or vomiting  Post-op Vital Signs: Reviewed and stable  Complications: No apparent anesthesia complications

## 2013-04-22 NOTE — Progress Notes (Signed)
UR COMPLETED  

## 2013-04-22 NOTE — Progress Notes (Signed)
Orthopedic Tech Progress Note Patient Details:  Rhonda Wells 02-18-1956 [id number] CPM applied to Right LE with appropriate settings. OHF applied to bed.  CPM Right Knee CPM Right Knee: On Right Knee Flexion (Degrees): 40 Right Knee Extension (Degrees): 0 Additional Comments:  (on for only 2 hours today)   [place] R Thompson 04/22/2013, 12:20 PM

## 2013-04-22 NOTE — H&P (Signed)
TOTAL KNEE ADMISSION H&P  Patient is being admitted for right total knee arthroplasty.  Subjective:  Chief Complaint:right knee pain.  HPI: Rhonda Wells, 57 y.o. female, has a history of pain and functional disability in the right knee due to arthritis and has failed non-surgical conservative treatments for greater than 12 weeks to includeNSAID's and/or analgesics, corticosteriod injections, flexibility and strengthening excercises, use of assistive devices and activity modification.  Onset of symptoms was gradual, starting 7 years ago with gradually worsening course since that time. The patient noted no past surgery on the right knee(s).  Patient currently rates pain in the right knee(s) at 8 out of 10 with activity. Patient has night pain, worsening of pain with activity and weight bearing, pain that interferes with activities of daily living, crepitus and joint swelling.  Patient has evidence of subchondral sclerosis, periarticular osteophytes and joint space narrowing by imaging studies. This patient has had significantly worsening pain with ADLs and inability to work at job. There is no active infection.  There are no active problems to display for this patient.  Past Medical History  Diagnosis Date  . Hypertension   . Shortness of breath     hx fluid in lungs  20's  . Stroke     tia 10 -15 yrs ago  . Arthritis   . Anemia     Past Surgical History  Procedure Laterality Date  . Cesarean section      x3  . Anal fissurectomy      ?    Prescriptions prior to admission  Medication Sig Dispense Refill  . aspirin 81 MG chewable tablet Chew 81 mg by mouth daily.      . Cholecalciferol (VITAMIN D3) 400 UNITS CAPS Take 1 capsule by mouth daily.      . ferrous sulfate 325 (65 FE) MG tablet Take 325 mg by mouth daily with breakfast.      . Garlic 1000 MG CAPS Take 1 capsule by mouth daily.      Marland Kitchen loratadine (CLARITIN) 10 MG tablet Take 10 mg by mouth daily as needed for allergies. For  allergy symptoms      . magnesium citrate SOLN Take 0.5 Bottles by mouth daily as needed. For constipation      . traMADol (ULTRAM) 50 MG tablet Take 50 mg by mouth every 6 (six) hours as needed for pain.      Marland Kitchen triamterene-hydrochlorothiazide (MAXZIDE) 75-50 MG per tablet Take 0.5 tablets by mouth daily.      Marland Kitchen OVER THE COUNTER MEDICATION Take 1 tablet by mouth daily as needed. Medication-Curamin As needed for arthritis pain       No Known Allergies  History  Substance Use Topics  . Smoking status: Never Smoker   . Smokeless tobacco: Not on file     Comment: occ alcohol  . Alcohol Use: Yes    History reviewed. No pertinent family history.   Review of Systems  Constitutional: Negative.   HENT: Negative.   Eyes: Negative.   Respiratory: Negative.   Cardiovascular: Negative.   Gastrointestinal: Negative.   Genitourinary: Negative.   Musculoskeletal: Positive for joint pain.  Skin: Negative.   Neurological: Negative.   Endo/Heme/Allergies: Negative.   Psychiatric/Behavioral: Negative.     Objective:  Physical Exam  Constitutional: She appears well-developed.  HENT:  Head: Normocephalic.  Eyes: Pupils are equal, round, and reactive to light.  Neck: Normal range of motion.  Cardiovascular: Normal rate.   Respiratory: Effort normal.  GI: Soft.  Neurological: She is alert.  Skin: Skin is warm.  Psychiatric: She has a normal mood and affect.  dp 2/4 right with intact skin and varus alignment - rom 5 - 130 - effusion mild - med > lat joint line tenderness  Vital signs in last 24 hours: Temp:  [98 F (36.7 C)] 98 F (36.7 C) (06/17 0602) Pulse Rate:  [70] 70 (06/17 0602) Resp:  [18] 18 (06/17 0602) BP: (136)/(91) 136/91 mmHg (06/17 0602) SpO2:  [96 %] 96 % (06/17 0602)  Labs:   There is no weight on file to calculate BMI.   Imaging Review Plain radiographs demonstrate severe degenerative joint disease of the right knee(s). The overall alignment ismild varus. The  bone quality appears to be good for age and reported activity level.  Assessment/Plan:  End stage arthritis, right knee   The patient history, physical examination, clinical judgment of the provider and imaging studies are consistent with end stage degenerative joint disease of the right knee(s) and total knee arthroplasty is deemed medically necessary. The treatment options including medical management, injection therapy arthroscopy and arthroplasty were discussed at length. The risks and benefits of total knee arthroplasty were presented and reviewed. The risks due to aseptic loosening, infection, stiffness, patella tracking problems, thromboembolic complications and other imponderables were discussed. The patient acknowledged the explanation, agreed to proceed with the plan and consent was signed. Patient is being admitted for inpatient treatment for surgery, pain control, PT, OT, prophylactic antibiotics, VTE prophylaxis, progressive ambulation and ADL's and discharge planning. The patient is planning to be discharged home with home health services

## 2013-04-22 NOTE — Anesthesia Procedure Notes (Signed)
Procedure Name: Intubation Date/Time: 04/22/2013 7:34 AM Performed by: Marni Griffon Pre-anesthesia Checklist: Patient identified, Emergency Drugs available, Patient being monitored and Suction available Patient Re-evaluated:Patient Re-evaluated prior to inductionOxygen Delivery Method: Circle system utilized Preoxygenation: Pre-oxygenation with 100% oxygen Intubation Type: IV induction Ventilation: Mask ventilation without difficulty Laryngoscope Size: Mac and 3 Grade View: Grade I Tube type: Oral Tube size: 7.5 mm Number of attempts: 1 Airway Equipment and Method: Stylet Placement Confirmation: ETT inserted through vocal cords under direct vision,  breath sounds checked- equal and bilateral and positive ETCO2 Secured at: 22 (cm at teeth) cm Tube secured with: Tape Dental Injury: Teeth and Oropharynx as per pre-operative assessment

## 2013-04-22 NOTE — Op Note (Signed)
[name], [name]                 ACCOUNT NO.:  [id number]  MEDICAL RECORD NO.:  [id number]  LOCATION:  5N28C                        FACILITY:  [facility]  PHYSICIAN:  Burnard Bunting, M.D.    DATE OF BIRTH:  12-09-1955  DATE OF PROCEDURE: DATE OF DISCHARGE:                              OPERATIVE REPORT   PREOPERATIVE DIAGNOSIS:  Right knee arthritis.  POSTOPERATIVE DIAGNOSIS:  Right knee arthritis.  PROCEDURE:  Right total knee replacement.  SURGEON:  Burnard Bunting, M.D.  ASSISTANT:  Maud Deed.  ANESTHESIA:  General endotracheal.  ESTIMATED BLOOD LOSS:  25 mL.  DRAINS:  None.  TOURNIQUET TIME:  30 minutes at 300 mmHg.  INDICATION:  Rhonda Wells is a 57 year old patient with end-stage right knee arthritis presents for operative management after explanation of risks and benefits.  COMPONENTS UTILIZED:  Stryker triathlon knee, size 3 femur, size 3 tibia, 11 mm polyethylene insert, 29 mm offset 3 PEG patella, posterior cruciate sacrificing.  PROCEDURE IN DETAIL:  The patient was brought to the operating room, where general endotracheal anesthesia was induced.  Preop antibiotics administered.  Time-out was called.  Right leg was prescrubbed with alcohol, Betadine,allowed to air dry, prepped with DuraPrep solution and draped in a sterile manner.  Collier Flowers was used to cover the operative field.  Leg was elevated and exsanguinated with Esmarch wrap. Tourniquet was inflated.  Anterior approach to the knee was made.  Skin and subcutaneous tissue was sharply divided.  Median parapatellar approach was made and marked with a 1 Vicryl suture at the superior medial pole of the patella.  The fat pad partially excised.  Lateral patellofemoral ligament was released.  Minimal subperiosteal dissection performed on the medial side because this was a varus knee.  ACL, PCL released using intramedullary alignment, 9 mm resected off the least affected tibial plateau laterally.  A 10-mm resection  was then made off of the distal femur using again intramedullary alignment 5 degrees valgus.  This allowed 9 degree spacer with full extension.  Chamfer and box cut was made.  Tibia was keel punched.  Patella was prepared from 21- 12 using freehand cut with replacement using 29 mm patella of 9 mm.  The free cut and post cut patellar height at 21 mm.  A 9 mm and 11 mm trial spacers were then placed.  A 11 mm spacer allowed for full extension, excellent flexion, good stability to varus valgus stress at 0, 30, and 90 degrees.  An excellent patellar tracking using no thumbs technique. Trial components removed.  A 3 L pulsatile irrigation performed.  True components were then cemented into position.  Excess cement was removed. Same stability parameters were maintained.  Tourniquet was released. Bleeding points were controlled with electrocautery.  Thorough irrigation again performed with 3 more liters and the incision was then closed over bolster using #1 Vicryl suture followed by interrupted inverted 0 Vicryl suture, 2-0 Vicryl suture, and skin staples. Marcaine, morphine finally injected to the knee.  The patient tolerated the procedure without immediate complication.  Velna Hatchet Vernon's assistance required at all times for opening, closing, retraction, protection of neurovascular structures, drilling.  Her assistance was medical necessity.  Burnard Bunting, M.D.     GSD/MEDQ  D:  04/22/2013  T:  04/22/2013  Job:  [id number]

## 2013-04-22 NOTE — Evaluation (Signed)
Physical Therapy Evaluation Patient Details Name: Rhonda Wells MRN: [id number] DOB: 1956/05/15 Today's Date: 04/22/2013 Time: 8295-6213 PT Time Calculation (min): 26 min  PT Assessment / Plan / Recommendation Clinical Impression  Pt is 57 y/o female admitted for s/p right TKA presenting on POD#0.  Pt limited due to lethargy, nausea and pain.  Pt will benefit from acute PT services to improve overall mobility to prepare for safe d/c home.    PT Assessment  Patient needs continued PT services    Follow Up Recommendations  Home health PT;Supervision/Assistance - 24 hour    Does the patient have the potential to tolerate intense rehabilitation      Barriers to Discharge None      Equipment Recommendations  Rolling walker with 5" wheels (3n1)    Recommendations for Other Services     Frequency 7X/week    Precautions / Restrictions Precautions Precautions: Knee;Fall Required Braces or Orthoses: Knee Immobilizer - Right Knee Immobilizer - Right: On except when in CPM Restrictions Weight Bearing Restrictions: Yes RLE Weight Bearing: Weight bearing as tolerated   Pertinent Vitals/Pain Does not rate but moans and facial grimace with movement      Mobility  Bed Mobility Bed Mobility: Supine to Sit Supine to Sit: 4: Min assist;HOB elevated;With rails Sit to Supine: 3: Mod assist Details for Bed Mobility Assistance: (A) to elevate trunk OOB with cues for technique and (A) with right LE into/OOB  Transfers Transfers: Not assessed Ambulation/Gait Ambulation/Gait Assistance: Not tested (comment)    Exercises     PT Diagnosis: Difficulty walking;Acute pain  PT Problem List: Decreased strength;Decreased range of motion;Decreased activity tolerance;Decreased balance;Decreased mobility;Decreased knowledge of use of DME;Pain;Decreased knowledge of precautions PT Treatment Interventions: DME instruction;Gait training;Stair training;Functional mobility training;Therapeutic  activities;Therapeutic exercise;Modalities   PT Goals Acute Rehab PT Goals PT Goal Formulation: With patient/family Time For Goal Achievement: 04/29/13 Potential to Achieve Goals: Good Pt will go Supine/Side to Sit: with modified independence;with rail PT Goal: Supine/Side to Sit - Progress: Goal set today Pt will go Sit to Supine/Side: with modified independence;with rail PT Goal: Sit to Supine/Side - Progress: Goal set today Pt will go Sit to Stand: with modified independence PT Goal: Sit to Stand - Progress: Goal set today Pt will go Stand to Sit: with modified independence;with upper extremity assist PT Goal: Stand to Sit - Progress: Goal set today Pt will Ambulate: >150 feet;with modified independence;with rolling walker PT Goal: Ambulate - Progress: Goal set today Pt will Go Up / Down Stairs: 3-5 stairs;with min assist;with least restrictive assistive device PT Goal: Up/Down Stairs - Progress: Goal set today  Visit Information  Last PT Received On: 04/22/13 Assistance Needed: +1    Subjective Data  Subjective: "I'm feeling a little nauseated." Patient Stated Goal: To go to son't home and get better   Prior Functioning  Home Living Lives With: Alone Available Help at Discharge: Family (Going to [employer] home) Type of Home: House Home Access: Stairs to enter Secretary/administrator of Steps: 4 Entrance Stairs-Rails: None Home Layout: One level Bathroom Shower/Tub: Health visitor: Standard Bathroom Accessibility: Yes How Accessible: Accessible via walker Home Adaptive Equipment: None Prior Function Level of Independence: Independent Able to Take Stairs?: Yes Communication Communication: No difficulties Dominant Hand: Right    Cognition  Cognition Arousal/Alertness: Awake/alert (easily goes to sleep) Behavior During Therapy: WFL for tasks assessed/performed Overall Cognitive Status: Within Functional Limits for tasks assessed    Extremity/Trunk  Assessment Left Lower Extremity Assessment LLE  ROM/Strength/Tone: Unable to fully assess;Due to pain   Balance    End of Session PT - End of Session Activity Tolerance: Other (comment) (Limited by fatigue and nauseated) Patient left: in bed;with call bell/phone within reach Nurse Communication: Mobility status CPM Right Knee CPM Right Knee: Off Right Knee Flexion (Degrees): 40  GP     Damilola Flamm 04/22/2013, 5:03 PM [place], PT DPT [phone]

## 2013-04-22 NOTE — Progress Notes (Signed)
Complaint of nausea Dr. Sampson Goon called order received for antimetic

## 2013-04-22 NOTE — Anesthesia Preprocedure Evaluation (Addendum)
Anesthesia Evaluation  Patient identified by MRN, date of birth, ID band Patient awake    Reviewed: Allergy & Precautions, H&P , NPO status , Patient's Chart, lab work & pertinent test results, reviewed documented beta blocker date and time   Airway Mallampati: I TM Distance: >3 FB Neck ROM: Full    Dental no notable dental hx. (+) Teeth Intact, Missing and Dental Advisory Given   Pulmonary shortness of breath and with exertion,    Pulmonary exam normal       Cardiovascular hypertension, On Medications and Pt. 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, Residual Symptoms negative psych ROS   GI/Hepatic negative GI ROS, Neg liver ROS,   Endo/Other  negative endocrine ROS  Renal/GU negative Renal ROS  negative genitourinary   Musculoskeletal   Abdominal   Peds  Hematology negative hematology ROS (+)   Anesthesia Other Findings   Reproductive/Obstetrics negative OB ROS                        Anesthesia Physical Anesthesia Plan  ASA: II  Anesthesia Plan: General   Post-op Pain Management:    Induction: Intravenous  Airway Management Planned: Oral ETT  Additional Equipment:   Intra-op Plan:   Post-operative Plan: Extubation in OR  Informed Consent: I have reviewed the patients History and Physical, chart, labs and discussed the procedure including the risks, benefits and alternatives for the proposed anesthesia with the patient or authorized representative who has indicated his/her understanding and acceptance.   Dental advisory given  Plan Discussed with: CRNA and Surgeon  Anesthesia Plan Comments:         Anesthesia Quick Evaluation

## 2013-04-22 NOTE — Brief Op Note (Signed)
04/22/2013  10:25 AM  PATIENT:  Keturah Barre  57 y.o. female  PRE-OPERATIVE DIAGNOSIS:  Right Knee Osteoarthritis  POST-OPERATIVE DIAGNOSIS:  Right Knee Osteoarthritis  PROCEDURE:  Procedure(s): TOTAL KNEE ARTHROPLASTY  SURGEON:  Surgeon(s): Cammy Copa, MD  ASSISTANT: S vernon pa  ANESTHESIA:   general  EBL: 50 ml    Total I/O In: 1050 [I.V.:1050] Out: 275 [Urine:200; Blood:75]  BLOOD ADMINISTERED: none  DRAINS: none   LOCAL MEDICATIONS USED:  none  SPECIMEN:  No Specimen  COUNTS:  YES  TOURNIQUET:   Total Tourniquet Time Documented: Thigh (Right) - 90 minutes Total: Thigh (Right) - 90 minutes   DICTATION: .Other Dictation: Dictation Number [id number]  PLAN OF CARE: Admit to inpatient   PATIENT DISPOSITION:  PACU - hemodynamically stable

## 2013-04-22 NOTE — Transfer of Care (Signed)
Immediate Anesthesia Transfer of Care Note  Patient: Rhonda Wells  Procedure(s) Performed: Procedure(s) with comments: TOTAL KNEE ARTHROPLASTY (Right) - Right Total Knee Arthroplasty  Patient Location: PACU  Anesthesia Type:General  Level of Consciousness: awake  Airway & Oxygen Therapy: Patient Spontanous Breathing and Patient connected to nasal cannula oxygen  Post-op Assessment: Report given to PACU RN, Post -op Vital signs reviewed and stable and Patient moving all extremities  Post vital signs: Reviewed and stable  Complications: No apparent anesthesia complications

## 2013-04-22 NOTE — Preoperative (Signed)
Beta Blockers   Reason not to administer Beta Blockers:Not Applicable 

## 2013-04-23 ENCOUNTER — Encounter (HOSPITAL_COMMUNITY): Payer: Self-pay | Admitting: General Practice

## 2013-04-23 LAB — BASIC METABOLIC PANEL
BUN: 7 mg/dL (ref 6–23)
Calcium: 8.9 mg/dL (ref 8.4–10.5)
GFR calc Af Amer: >90 mL/min (ref >90)
GFR calc non Af Amer: >90 mL/min (ref >90)
Potassium: 3.3 mEq/L — ABNORMAL LOW (ref 3.5–5.1)
Sodium: 136 mEq/L (ref 135–145)

## 2013-04-23 LAB — PROTIME-INR
INR: 1.06 (ref 0.00–1.49)
Prothrombin Time: 13.7 seconds (ref 11.6–15.2)

## 2013-04-23 LAB — CBC
MCH: 30.8 pg (ref 26.0–34.0)
MCHC: 34.4 g/dL (ref 30.0–36.0)
RDW: 12.7 % (ref 11.5–15.5)

## 2013-04-23 MED ORDER — WARFARIN SODIUM 5 MG PO TABS
5.0000 mg | ORAL_TABLET | Freq: Once | ORAL | Status: AC
  Administered 2013-04-23: 5 mg via ORAL
  Filled 2013-04-23: qty 1

## 2013-04-23 NOTE — Progress Notes (Signed)
Subjective: Pt stable - pain controlled   Objective: Vital signs in last 24 hours: Temp:  [98.3 F (36.8 C)-99.8 F (37.7 C)] 98.5 F (36.9 C) (06/18 1400) Pulse Rate:  [69-76] 76 (06/18 1400) Resp:  [12-18] 16 (06/18 1400) BP: (133-142)/(70-83) 133/76 mmHg (06/18 1400) SpO2:  [93 %-100 %] 93 % (06/18 1400)  Intake/Output from previous day: 06/17 0701 - 06/18 0700 In: 3040 [P.O.:240; I.V.:2800] Out: 2220 [Urine:2145; Blood:75] Intake/Output this shift: Total I/O In: -  Out: 550 [Urine:550]  Exam:  Neurovascular intact Sensation intact distally Intact pulses distally  Labs:  Recent Labs  04/23/13 0530  HGB 11.8*    Recent Labs  04/23/13 0530  WBC 6.7  RBC 3.83*  HCT 34.3*  PLT 179    Recent Labs  04/23/13 0530  NA 136  K 3.3*  CL 99  CO2 29  BUN 7  CREATININE 0.67  GLUCOSE 135*  CALCIUM 8.9    Recent Labs  04/23/13 0530  INR 1.06    Assessment/Plan: Plan cpm - po pain meds - pt for rom - tol cpm today increase tomorrow - possible dc friday   DEAN,GREGORY SCOTT 04/23/2013, 6:00 PM

## 2013-04-23 NOTE — Progress Notes (Signed)
ANTICOAGULATION CONSULT NOTE - Follow Up Consult  Pharmacy Consult for coumadin Indication: VTE prophylaxis  No Known Allergies  Patient Measurements: Height: 5\' 1"  (154.9 cm) Weight: 148 lb 4.8 oz (67.268 kg) IBW/kg (Calculated) : 47.8   Vital Signs: Temp: 99.8 F (37.7 C) (06/18 0337) Temp src: Oral (06/18 0337) BP: 139/70 mmHg (06/18 0337) Pulse Rate: 72 (06/18 0337)  Labs:  Recent Labs  04/23/13 0530  HGB 11.8*  HCT 34.3*  PLT 179  LABPROT 13.7  INR 1.06  CREATININE 0.67    Estimated Creatinine Clearance: 68.1 ml/min (by C-G formula based on Cr of 0.67).  Assessment: Patient is a 57 y.o F s/p right TKA on coumadin for VTE prophylaxis.  INR is 1.06 today after one dose of coumadin 5mg  given last night.    Goal of Therapy:  INR 2-3    Plan:  1) repeat coumadin 5mg  PO x1 today   Orly Quimby P 04/23/2013,1:19 PM

## 2013-04-23 NOTE — Progress Notes (Signed)
Physical Therapy Treatment Patient Details Name: Rhonda Wells MRN: [id number] DOB: 05-19-1956 Today's Date: 04/23/2013 Time: 0927-1007 PT Time Calculation (min): 40 min  PT Assessment / Plan / Recommendation Comments on Treatment Session  Patient able to progress to sitting in recliner. Anticipatory pain and anxiety limited mobility at this time. Will work on increasing mobility/ambulation this afternoon    Follow Up Recommendations  Home health PT;Supervision/Assistance - 24 hour     Does the patient have the potential to tolerate intense rehabilitation     Barriers to Discharge        Equipment Recommendations  Rolling walker with 5" wheels    Recommendations for Other Services    Frequency 7X/week   Plan Discharge plan remains appropriate;Frequency remains appropriate    Precautions / Restrictions Precautions Required Braces or Orthoses: Knee Immobilizer - Right Knee Immobilizer - Right: On except when in CPM Restrictions RLE Weight Bearing: Weight bearing as tolerated   Pertinent Vitals/Pain 7/10 knee pain. RN gave relaxer while in room.     Mobility  Bed Mobility Supine to Sit: 4: Min assist;HOB elevated;With rails Details for Bed Mobility Assistance: A to hold R LE while getting to EOB. Cues for technique Transfers Transfers: Sit to Stand Sit to Stand: 3: Mod assist;From bed;With upper extremity assist Stand to Sit: 4: Min assist;With armrests;To chair/3-in-1;With upper extremity assist Details for Transfer Assistance: A to initiate stand and to ensure balance. Cues for technique and safe hand placement Ambulation/Gait Ambulation/Gait Assistance: 4: Min assist Ambulation Distance (Feet): 4 Feet Assistive device: Rolling walker Ambulation/Gait Assistance Details: Patient able to take some small steps to recliner. Cues for technique and RW management. A to advance R LE and shift weight.  Gait Pattern: Step-to pattern;Decreased step length - left;Decreased stance  time - right;Decreased weight shift to left;Decreased step length - right;Antalgic Gait velocity: decreased    Exercises Total Joint Exercises Ankle Circles/Pumps: AROM;Both;10 reps Quad Sets: AAROM;Right;10 reps Heel Slides: AAROM;Right;10 reps Hip ABduction/ADduction: AAROM;Right;10 reps   PT Diagnosis:    PT Problem List:   PT Treatment Interventions:     PT Goals Acute Rehab PT Goals PT Goal: Supine/Side to Sit - Progress: Progressing toward goal PT Goal: Sit to Stand - Progress: Progressing toward goal PT Goal: Stand to Sit - Progress: Progressing toward goal PT Goal: Ambulate - Progress: Progressing toward goal  Visit Information  Last PT Received On: 04/23/13 Assistance Needed: +1    Subjective Data      Cognition  Cognition Arousal/Alertness: Awake/alert Behavior During Therapy: WFL for tasks assessed/performed Overall Cognitive Status: Within Functional Limits for tasks assessed    Balance     End of Session PT - End of Session Equipment Utilized During Treatment: Gait belt Activity Tolerance: Patient limited by fatigue;Patient limited by pain Patient left: in chair;with call bell/phone within reach Nurse Communication: Mobility status   GP     Fredrich Birks 04/23/2013, 10:15 AM 04/23/2013 Fredrich Birks PTA [phone] pager [phone] office

## 2013-04-23 NOTE — Evaluation (Signed)
Occupational Therapy Evaluation Patient Details Name: ANELA Wells MRN: [id number] DOB: 02/08/1956 Today's Date: 04/23/2013 Time: 9528-4132 OT Time Calculation (min): 31 min  OT Assessment / Plan / Recommendation Clinical Impression  Pt demos decline in function with ADLs and ADL mobility following R TKA. Pt would benefit from acute OT services to address impairments to help restore PLOF to return home safely    OT Assessment  Patient needs continued OT Services    Follow Up Recommendations  Home health OT    Barriers to Discharge None    Equipment Recommendations  3 in 1 bedside comode;Tub/shower seat    Recommendations for Other Services    Frequency  Min 2X/week    Precautions / Restrictions Precautions Precautions: Knee;Fall Required Braces or Orthoses: Knee Immobilizer - Right Knee Immobilizer - Right: On except when in CPM Restrictions Weight Bearing Restrictions: Yes RLE Weight Bearing: Weight bearing as tolerated       ADL  Grooming: Performed;Wash/dry hands;Wash/dry face;Min guard Where Assessed - Grooming: Supported standing Upper Body Bathing: Simulated;Supervision/safety;Set up Lower Body Bathing: Simulated;Maximal assistance Upper Body Dressing: Performed;Supervision/safety;Set up Lower Body Dressing: Performed;Maximal assistance Toilet Transfer: Performed;Min guard;Minimal assistance Toilet Transfer Method: Sit to stand Toilet Transfer Equipment: Bedside commode Toileting - Clothing Manipulation and Hygiene: Performed;Moderate assistance Where Assessed - Toileting Clothing Manipulation and Hygiene: Standing Transfers/Ambulation Related to ADLs: cues for correct hand placement and technique    OT Diagnosis: Generalized weakness;Acute pain  OT Problem List: Decreased strength;Decreased knowledge of use of DME or AE;Decreased activity tolerance;Pain;Decreased safety awareness;Impaired balance (sitting and/or standing) OT Treatment Interventions:  Self-care/ADL training;Balance training;Therapeutic exercise;Neuromuscular education;Therapeutic activities;DME and/or AE instruction;Patient/family education   OT Goals Acute Rehab OT Goals OT Goal Formulation: With patient/family Time For Goal Achievement: 04/30/13 Potential to Achieve Goals: Good ADL Goals Pt Will Perform Grooming: with set-up;with supervision;Standing at sink ADL Goal: Grooming - Progress: Goal set today Pt Will Perform Lower Body Bathing: with min assist;with mod assist;with adaptive equipment ADL Goal: Lower Body Bathing - Progress: Goal set today Pt Will Perform Lower Body Dressing: with min assist;with mod assist;with adaptive equipment ADL Goal: Lower Body Dressing - Progress: Goal set today Pt Will Transfer to Toilet: with supervision;with DME;Grab bars ADL Goal: Toilet Transfer - Progress: Goal set today Pt Will Perform Toileting - Clothing Manipulation: with min assist;with supervision;Standing ADL Goal: Toileting - Clothing Manipulation - Progress: Goal set today Pt Will Perform Toileting - Hygiene: with min assist;with supervision;Sitting on 3-in-1 or toilet;Standing at 3-in-1/toilet ADL Goal: Toileting - Hygiene - Progress: Goal set today Pt Will Perform Tub/Shower Transfer: with supervision;with min assist;with DME;Grab bars ADL Goal: Tub/Shower Transfer - Progress: Goal set today  Visit Information  Last OT Received On: 04/23/13 Assistance Needed: +1 PT/OT Co-Evaluation/Treatment: Yes    Subjective Data  Subjective: " I am ok " Patient Stated Goal: To return home   Prior Functioning     Home Living Lives With: Alone Available Help at Discharge: Family Type of Home: House Home Access: Stairs to enter Secretary/administrator of Steps: 4 Entrance Stairs-Rails: None Home Layout: One level Bathroom Shower/Tub: Health visitor: Standard Bathroom Accessibility: Yes How Accessible: Accessible via walker Home Adaptive Equipment:  None Additional Comments: pt plans to d/c to her son's home Prior Function Level of Independence: Independent Able to Take Stairs?: Yes Driving: Yes Communication Communication: No difficulties Dominant Hand: Right         Vision/Perception Vision - History Baseline Vision: Wears glasses all the time Patient Visual  Report: No change from baseline Perception Perception: Within Functional Limits   Cognition  Cognition Arousal/Alertness: Awake/alert Behavior During Therapy: WFL for tasks assessed/performed Overall Cognitive Status: Within Functional Limits for tasks assessed    Extremity/Trunk Assessment Right Upper Extremity Assessment RUE ROM/Strength/Tone: WFL for tasks assessed RUE Sensation: WFL - Light Touch RUE Coordination: WFL - gross/fine motor Left Upper Extremity Assessment LUE ROM/Strength/Tone: WFL for tasks assessed LUE Sensation: WFL - Light Touch LUE Coordination: WFL - gross/fine motor     Mobility Bed Mobility Bed Mobility: Sit to Supine;Scooting to HOB Sit to Supine: 4: Min assist;HOB flat Details for Bed Mobility Assistance: A to hold R LE while getting into bed. Cues for technique Transfers Sit to Stand: 4: Min guard;With upper extremity assist;From chair/3-in-1;With armrests Stand to Sit: 4: Min guard;With armrests;To bed;To chair/3-in-1 Details for Transfer Assistance:  Cues for technique and safe hand placement.     Exercise     Balance Balance Balance Assessed: No   End of Session OT - End of Session Equipment Utilized During Treatment: Gait belt;Other (comment) (BSC, RW) Activity Tolerance: Patient tolerated treatment well Patient left: in bed;with call bell/phone within reach;with family/visitor present  GO     Galen Manila 04/23/2013, 3:02 PM

## 2013-04-23 NOTE — Progress Notes (Signed)
Physical Therapy Treatment Patient Details Name: ANNELY SLIVA MRN: [id number] DOB: [birth year] Today's Date: 04/23/2013 Time: 7829-5621 PT Time Calculation (min): 31 min  PT Assessment / Plan / Recommendation Comments on Treatment Session  Patient able to make some small progress this afternoon. Continue working on ambulation as patient is planning to DC home    Follow Up Recommendations  Home health PT;Supervision/Assistance - 24 hour     Does the patient have the potential to tolerate intense rehabilitation     Barriers to Discharge        Equipment Recommendations  Rolling walker with 5" wheels    Recommendations for Other Services    Frequency 7X/week   Plan Discharge plan remains appropriate;Frequency remains appropriate    Precautions / Restrictions Precautions Precautions: Knee;Fall Required Braces or Orthoses: Knee Immobilizer - Right Knee Immobilizer - Right: On except when in CPM Restrictions RLE Weight Bearing: Weight bearing as tolerated   Pertinent Vitals/Pain     Mobility  Bed Mobility Bed Mobility: Supine to Sit Sit to Supine: 4: Min assist;HOB flat Details for Bed Mobility Assistance: A to hold R LE while getting into bed. Cues for technique Transfers Sit to Stand: 4: Min guard;With upper extremity assist;From chair/3-in-1;With armrests Stand to Sit: 4: Min guard;With armrests;To bed;To chair/3-in-1 Details for Transfer Assistance:  Cues for technique and safe hand placement. Patient able to stand x3 with increased time but no physical A.  Ambulation/Gait Ambulation/Gait Assistance: 4: Min assist Ambulation Distance (Feet): 20 Feet (one seated rest break) Assistive device: Rolling walker Ambulation/Gait Assistance Details: Patient able to take bigger steps this session. Still apphrensive but progressing well  Gait Pattern: Step-to pattern Gait velocity: decreased    Exercises     PT Diagnosis:    PT Problem List:   PT Treatment Interventions:      PT Goals Acute Rehab PT Goals PT Goal: Sit to Supine/Side - Progress: Progressing toward goal PT Goal: Sit to Stand - Progress: Progressing toward goal PT Goal: Stand to Sit - Progress: Progressing toward goal PT Goal: Ambulate - Progress: Progressing toward goal  Visit Information  Last PT Received On: 04/23/13 Assistance Needed: +1    Subjective Data      Cognition  Cognition Arousal/Alertness: Awake/alert Behavior During Therapy: WFL for tasks assessed/performed Overall Cognitive Status: Within Functional Limits for tasks assessed    Balance     End of Session PT - End of Session Equipment Utilized During Treatment: Gait belt Activity Tolerance: Patient tolerated treatment well;Patient limited by fatigue Patient left: in bed;in CPM;with call bell/phone within reach Nurse Communication: Mobility status   GP     Fredrich Birks 04/23/2013, 2:54 PM  04/23/2013 Fredrich Birks PTA [phone] pager [phone] office

## 2013-04-23 NOTE — Plan of Care (Signed)
Problem: Phase II Progression Outcomes Goal: Discharge plan established Recommend HH OT for ADL trg and ADL mobility safety trg, recommend 3 i 1 and shower chair

## 2013-04-24 ENCOUNTER — Encounter (HOSPITAL_COMMUNITY): Payer: Self-pay | Admitting: Orthopedic Surgery

## 2013-04-24 LAB — CBC
HCT: 31.3 % — ABNORMAL LOW (ref 36.0–46.0)
Hemoglobin: 10.9 g/dL — ABNORMAL LOW (ref 12.0–15.0)
MCH: 31.1 pg (ref 26.0–34.0)
MCHC: 34.8 g/dL (ref 30.0–36.0)
MCV: 89.4 fL (ref 78.0–100.0)
Platelets: 156 K/uL (ref 150–400)
RBC: 3.5 MIL/uL — ABNORMAL LOW (ref 3.87–5.11)
RDW: 12.7 % (ref 11.5–15.5)
WBC: 6.8 K/uL (ref 4.0–10.5)

## 2013-04-24 LAB — PROTIME-INR: INR: 1.3 (ref 0.00–1.49)

## 2013-04-24 MED ORDER — METHOCARBAMOL 500 MG PO TABS: 500.0000 mg | ORAL_TABLET | Freq: Four times a day (QID) | ORAL | Status: DC | PRN

## 2013-04-24 MED ORDER — DSS 100 MG PO CAPS: 100.0000 mg | ORAL_CAPSULE | Freq: Two times a day (BID) | ORAL | Status: DC

## 2013-04-24 MED ORDER — OXYCODONE HCL 5 MG PO TABS: 5.0000 mg | ORAL_TABLET | ORAL | Status: DC | PRN

## 2013-04-24 MED ORDER — WARFARIN SODIUM 5 MG PO TABS: 5.0000 mg | ORAL_TABLET | Freq: Every day | ORAL | Status: DC

## 2013-04-24 MED ORDER — WARFARIN SODIUM 5 MG PO TABS
5.0000 mg | ORAL_TABLET | Freq: Once | ORAL | Status: AC
  Administered 2013-04-24: 5 mg via ORAL
  Filled 2013-04-24: qty 1

## 2013-04-24 NOTE — Care Management Note (Signed)
    Page 1 of 2   04/24/2013     11:47:35 AM   CARE MANAGEMENT NOTE 04/24/2013  Patient:  Rhonda Wells, Rhonda Wells   Account Number:  [id number]  Date Initiated:  04/24/2013  Documentation initiated by:  [facility]  Subjective/Objective Assessment:   admitted postop rt total knee     Action/Plan:   PT/OT evals-recommended HHPT,HHOT   Anticipated DC Date:  04/25/2013   Anticipated DC Plan:  HOME W HOME HEALTH SERVICES      DC Planning Services  CM consult      Choice offered to / List presented to:  [name]   DME arranged  3-N-1  WALKER - ROLLING  CPM      DME agency  [employer]     HH arranged  HH-1 RN  HH-2 PT  HH-3 OT      HH agency  [facility]   Status of service:  Completed, signed off Medicare Important Message given?   (If response is "NO", the following Medicare IM given date fields will be blank) Date Medicare IM given:   Date Additional Medicare IM given:    Discharge Disposition:  HOME W HOME HEALTH SERVICES  Per UR Regulation:  Reviewed for med. necessity/level of care/duration of stay  If discussed at Long Length of Stay Meetings, dates discussed:    Comments:  04/24/13 Spoke with patient about HHC, she chose [facility] from [facility] agencies list. Rhonda Wells at [facility] and set up [facility] secondary to Coumadin, HHPT and HHOT . Equipment has been delivered to patient by [employer]. Patient will be staying with her son at [address] in [place]. Patient's son Rhonda Wells will be staying with her tel # [phone]. Rhonda Cree RN, BSN, CCM

## 2013-04-24 NOTE — Progress Notes (Signed)
Occupational Therapy Treatment Patient Details Name: Rhonda Wells MRN: [id number] DOB: 27-Jul-1956 Today's Date: 04/24/2013 Time: 4098-1191 OT Time Calculation (min): 15 min  OT Assessment / Plan / Recommendation Comments on Treatment Session Pt making progress. Anticipate possible d/c tomorrow.    Follow Up Recommendations    HHOT   Barriers to Discharge       Equipment Recommendations  3 in 1 bedside comode    Recommendations for Other Services    Frequency Min 2X/week   Plan Discharge plan remains appropriate    Precautions / Restrictions Precautions Precautions: Knee;Fall Required Braces or Orthoses: Knee Immobilizer - Right Restrictions Weight Bearing Restrictions: Yes RLE Weight Bearing: Weight bearing as tolerated   Pertinent Vitals/Pain See vitals    ADL  Lower Body Bathing: Simulated;Minimal assistance Where Assessed - Lower Body Bathing: Unsupported sit to stand Lower Body Dressing: Simulated;Moderate assistance Where Assessed - Lower Body Dressing: Unsupported sit to stand Toilet Transfer: Performed;Supervision/safety Toilet Transfer Method:  (ambulting) Equipment Used: Knee Immobilizer;Rolling walker Transfers/Ambulation Related to ADLs: supervision with RW ADL Comments: Pt reports she will have her daughter to assist with ADLs (LB dressing/bathing primarly) at home. Pt reports she plans to wear dresses for ease and comfort.  Pt had 3n1 delivered in room. Reviewed use of 3n1 over toilet and in shower as shower chair, and pt verbalized understanding.  Pt demo'ing safe toilet transfer.     OT Diagnosis:    OT Problem List:   OT Treatment Interventions:     OT Goals ADL Goals Pt Will Perform Lower Body Bathing: with min assist;with mod assist;with adaptive equipment ADL Goal: Lower Body Bathing - Progress: Met Pt Will Perform Lower Body Dressing: with min assist;with mod assist;with adaptive equipment ADL Goal: Lower Body Dressing - Progress: Met Pt Will  Transfer to Toilet: with supervision;with DME;Grab bars ADL Goal: Toilet Transfer - Progress: Met  Visit Information  Last OT Received On: 04/24/13 Assistance Needed: +1    Subjective Data      Prior Functioning       Cognition  Cognition Arousal/Alertness: Awake/alert Behavior During Therapy: WFL for tasks assessed/performed Overall Cognitive Status: Within Functional Limits for tasks assessed    Mobility  Bed Mobility Bed Mobility: Supine to Sit;Sitting - Scoot to Edge of Bed;Sit to Supine Supine to Sit: 5: Supervision Sitting - Scoot to Edge of Bed: 5: Supervision Sit to Supine: 5: Supervision Transfers Transfers: Sit to Stand;Stand to Sit Sit to Stand: 5: Supervision;From bed Stand to Sit: 5: Supervision;To bed    Exercises     Balance     End of Session OT - End of Session Equipment Utilized During Treatment: Right knee immobilizer Activity Tolerance: Patient limited by fatigue Patient left: in bed;with call bell/phone within reach  GO    04/24/2013 Cipriano Mile OTR/L Pager [phone] Office [phone]  Cipriano Mile 04/24/2013, 4:35 PM

## 2013-04-24 NOTE — Progress Notes (Signed)
Physical Therapy Treatment Patient Details Name: Rhonda Wells MRN: [id number] DOB: 24-Jul-1956 Today's Date: 04/24/2013 Time: 4010-2725 PT Time Calculation (min): 25 min  PT Assessment / Plan / Recommendation Comments on Treatment Session  Patient progressing much better today. Able to increase ambulation with less anxiety. Will need to attempt steps next session. Possible DC tomorrow    Follow Up Recommendations  Home health PT;Supervision/Assistance - 24 hour     Does the patient have the potential to tolerate intense rehabilitation     Barriers to Discharge        Equipment Recommendations  Rolling walker with 5" wheels    Recommendations for Other Services    Frequency 7X/week   Plan Discharge plan remains appropriate;Frequency remains appropriate    Precautions / Restrictions Restrictions RLE Weight Bearing: Weight bearing as tolerated   Pertinent Vitals/Pain 6/10 knee pain.     Mobility  Bed Mobility Supine to Sit: 5: Supervision Transfers Sit to Stand: 4: Min guard;From bed;With upper extremity assist Stand to Sit: 4: Min guard;With armrests;With upper extremity assist;To chair/3-in-1 Details for Transfer Assistance:  Cues for technique and safe hand placement. Ambulation/Gait Ambulation/Gait Assistance: 4: Min guard Ambulation Distance (Feet): 80 Feet Assistive device: Rolling walker Ambulation/Gait Assistance Details: Cues to increase step length. Required one standing rest break.  Gait Pattern: Step-to pattern Gait velocity: decreased    Exercises Total Joint Exercises Quad Sets: Right;10 reps;AROM Heel Slides: AAROM;Right;10 reps Hip ABduction/ADduction: AAROM;Right;10 reps Straight Leg Raises: AAROM;Right;10 reps   PT Diagnosis:    PT Problem List:   PT Treatment Interventions:     PT Goals Acute Rehab PT Goals PT Goal: Supine/Side to Sit - Progress: Progressing toward goal PT Goal: Sit to Stand - Progress: Progressing toward goal PT Goal: Stand  to Sit - Progress: Progressing toward goal PT Goal: Ambulate - Progress: Progressing toward goal  Visit Information  Last PT Received On: 04/24/13 Assistance Needed: +1    Subjective Data      Cognition  Cognition Arousal/Alertness: Awake/alert Behavior During Therapy: WFL for tasks assessed/performed Overall Cognitive Status: Within Functional Limits for tasks assessed    Balance     End of Session PT - End of Session Equipment Utilized During Treatment: Gait belt Activity Tolerance: Patient tolerated treatment well Patient left: in chair;with call bell/phone within reach Nurse Communication: Mobility status   GP     Fredrich Birks 04/24/2013, 9:09 AM 04/24/2013 Fredrich Birks PTA [phone] pager [phone] office

## 2013-04-24 NOTE — Progress Notes (Signed)
Subjective: Pt stable - pain controlled   Objective: Vital signs in last 24 hours: Temp:  [98.9 F (37.2 C)] 98.9 F (37.2 C) (06/19 1400) Pulse Rate:  [72-112] 112 (06/19 1400) Resp:  [16-18] 16 (06/19 1400) BP: (135-140)/(65-91) 140/91 mmHg (06/19 1400) SpO2:  [99 %-100 %] 100 % (06/19 1400)  Intake/Output from previous day: 06/18 0701 - 06/19 0700 In: 240 [P.O.:240] Out: 550 [Urine:550] Intake/Output this shift:    Exam:  Neurovascular intact Sensation intact distally Intact pulses distally Dorsiflexion/Plantar flexion intact  Labs:  Recent Labs  04/23/13 0530 04/24/13 0420  HGB 11.8* 10.9*    Recent Labs  04/23/13 0530 04/24/13 0420  WBC 6.7 6.8  RBC 3.83* 3.50*  HCT 34.3* 31.3*  PLT 179 156    Recent Labs  04/23/13 0530  NA 136  K 3.3*  CL 99  CO2 29  BUN 7  CREATININE 0.67  GLUCOSE 135*  CALCIUM 8.9    Recent Labs  04/23/13 0530 04/24/13 0420  INR 1.06 1.30    Assessment/Plan: pla dc pm with [facility] and pt - needs to increase cpm   DEAN,GREGORY SCOTT 04/24/2013, 4:57 PM

## 2013-04-24 NOTE — Progress Notes (Signed)
Physical Therapy Treatment Patient Details Name: KALEESI GUYTON MRN: [id number] DOB: July 11, 1956 Today's Date: 04/24/2013 Time: 4098-1191 PT Time Calculation (min): 24 min  PT Assessment / Plan / Recommendation Comments on Treatment Session  Patient continuing to show great progress with ambulation and showing more confidence with mobility. States that she can walk straight into back side of her sons house without having to do steps. She will confirm tonight with her son. Anticipate DC tomorrow.     Follow Up Recommendations  Home health PT;Supervision/Assistance - 24 hour     Does the patient have the potential to tolerate intense rehabilitation     Barriers to Discharge        Equipment Recommendations  Rolling walker with 5" wheels    Recommendations for Other Services    Frequency 7X/week   Plan Discharge plan remains appropriate;Frequency remains appropriate    Precautions / Restrictions Precautions Precautions: Knee;Fall Required Braces or Orthoses: Knee Immobilizer - Right Restrictions RLE Weight Bearing: Weight bearing as tolerated   Pertinent Vitals/Pain     Mobility  Bed Mobility Supine to Sit: 5: Supervision Sit to Supine: 5: Supervision Transfers Sit to Stand: 5: Supervision;With upper extremity assist;From bed Stand to Sit: 5: Supervision;With upper extremity assist;To bed Ambulation/Gait Ambulation/Gait Assistance: 5: Supervision Ambulation Distance (Feet): 120 Feet Assistive device: Rolling walker Ambulation/Gait Assistance Details: Cues for posture. Patient taking longer steps and relaxing more with gait Gait Pattern: Step-to pattern Gait velocity: increasing    Exercises Total Joint Exercises Quad Sets: Right;10 reps;AROM Heel Slides: AAROM;Right;10 reps Hip ABduction/ADduction: AAROM;Right;10 reps Straight Leg Raises: AAROM;Right;10 reps   PT Diagnosis:    PT Problem List:   PT Treatment Interventions:     PT Goals Acute Rehab PT Goals PT  Goal: Supine/Side to Sit - Progress: Progressing toward goal PT Goal: Sit to Supine/Side - Progress: Progressing toward goal PT Goal: Sit to Stand - Progress: Progressing toward goal PT Goal: Stand to Sit - Progress: Progressing toward goal PT Goal: Ambulate - Progress: Progressing toward goal  Visit Information  Last PT Received On: 04/24/13 Assistance Needed: +1    Subjective Data      Cognition  Cognition Arousal/Alertness: Awake/alert Behavior During Therapy: WFL for tasks assessed/performed Overall Cognitive Status: Within Functional Limits for tasks assessed    Balance     End of Session PT - End of Session Equipment Utilized During Treatment: Gait belt Activity Tolerance: Patient tolerated treatment well Patient left: in bed;with call bell/phone within reach Nurse Communication: Mobility status   GP     Fredrich Birks 04/24/2013, 2:49 PM  04/24/2013 Fredrich Birks PTA [phone] pager [phone] office

## 2013-04-24 NOTE — Progress Notes (Signed)
ANTICOAGULATION CONSULT NOTE - Follow Up Consult  Pharmacy Consult for coumadin Indication: VTE prophylaxis  No Known Allergies  Patient Measurements: Height: 5\' 1"  (154.9 cm) Weight: 148 lb 4.8 oz (67.268 kg) IBW/kg (Calculated) : 47.8   Vital Signs: Temp: 98.9 F (37.2 C) (06/19 0650) BP: 135/65 mmHg (06/19 0650) Pulse Rate: 72 (06/19 0650)  Labs:  Recent Labs  04/23/13 0530 04/24/13 0420  HGB 11.8* 10.9*  HCT 34.3* 31.3*  PLT 179 156  LABPROT 13.7 15.9*  INR 1.06 1.30  CREATININE 0.67  --     Estimated Creatinine Clearance: 68.1 ml/min (by C-G formula based on Cr of 0.67).  Assessment: Patient is a 57 y.o F s/p right TKA on coumadin for VTE prophylaxis.  INR is 1.06 > 1.3 after 2 dose of coumadin 5mg , hgb 10.9, plt 156, No bleeding noted per chart.  Goal of Therapy:  INR 2-3    Plan:  1) repeat coumadin 5mg  PO x1 today   Bayard Hugger, PharmD, BCPS  Clinical Pharmacist  Pager: [phone]   04/24/2013,11:41 AM

## 2013-04-25 LAB — CBC
HCT: 31.2 % — ABNORMAL LOW (ref 36.0–46.0)
MCHC: 34.9 g/dL (ref 30.0–36.0)
MCV: 88.1 fL (ref 78.0–100.0)
Platelets: 194 10*3/uL (ref 150–400)
RDW: 12.3 % (ref 11.5–15.5)
WBC: 8.3 10*3/uL (ref 4.0–10.5)

## 2013-04-25 NOTE — Progress Notes (Signed)
Physical Therapy Treatment Patient Details Name: Rhonda Wells MRN: [id number] DOB: 04-22-1956 Today's Date: 04/25/2013 Time: 4098-1191 PT Time Calculation (min): 26 min  PT Assessment / Plan / Recommendation Comments on Treatment Session  Patient has progressed well. Anticipate DC later today. Patient does not have steps to get into back of sons house    Follow Up Recommendations  Home health PT;Supervision/Assistance - 24 hour     Does the patient have the potential to tolerate intense rehabilitation     Barriers to Discharge        Equipment Recommendations  Rolling walker with 5" wheels    Recommendations for Other Services    Frequency 7X/week   Plan Discharge plan remains appropriate;Frequency remains appropriate    Precautions / Restrictions Precautions Precautions: Knee;Fall Required Braces or Orthoses: Knee Immobilizer - Right Restrictions RLE Weight Bearing: Weight bearing as tolerated   Pertinent Vitals/Pain 2/10 knee pain    Mobility  Bed Mobility Supine to Sit: 6: Modified independent (Device/Increase time) Sitting - Scoot to Edge of Bed: 6: Modified independent (Device/Increase time) Transfers Sit to Stand: 6: Modified independent (Device/Increase time) Stand to Sit: 6: Modified independent (Device/Increase time) Ambulation/Gait Ambulation/Gait Assistance: 5: Supervision Ambulation Distance (Feet): 250 Feet Assistive device: Rolling walker Gait Pattern: Step-through pattern;Decreased stride length    Exercises Total Joint Exercises Quad Sets: Right;10 reps;AROM Short Arc Quad: AAROM;Right;10 reps Heel Slides: AAROM;Right;10 reps Hip ABduction/ADduction: AAROM;Right;10 reps Straight Leg Raises: AAROM;Right;10 reps   PT Diagnosis:    PT Problem List:   PT Treatment Interventions:     PT Goals Acute Rehab PT Goals PT Goal: Supine/Side to Sit - Progress: Met PT Goal: Sit to Stand - Progress: Met PT Goal: Stand to Sit - Progress: Met PT Goal:  Ambulate - Progress: Progressing toward goal  Visit Information  Last PT Received On: 04/25/13 Assistance Needed: +1    Subjective Data      Cognition  Cognition Arousal/Alertness: Awake/alert Behavior During Therapy: WFL for tasks assessed/performed Overall Cognitive Status: Within Functional Limits for tasks assessed    Balance     End of Session PT - End of Session Equipment Utilized During Treatment: Gait belt Activity Tolerance: Patient tolerated treatment well Patient left: in bed;with call bell/phone within reach   GP     Fredrich Birks 04/25/2013, 8:04 AM 04/25/2013 Fredrich Birks PTA [phone] pager [phone] office

## 2013-04-25 NOTE — Progress Notes (Signed)
Subjective: Pt stable - pain ok   Objective: Vital signs in last 24 hours: Temp:  [98.8 F (37.1 C)-98.9 F (37.2 C)] 98.8 F (37.1 C) (06/19 2011) Pulse Rate:  [98-112] 98 (06/19 2011) Resp:  [16] 16 (06/19 2011) BP: (121-140)/(79-91) 121/79 mmHg (06/19 2011) SpO2:  [99 %-100 %] 99 % (06/19 2011)  Intake/Output from previous day: 06/19 0701 - 06/20 0700 In: 240 [P.O.:240] Out: -  Intake/Output this shift:    Exam:  Neurovascular intact Sensation intact distally Intact pulses distally  Labs:  Recent Labs  04/23/13 0530 04/24/13 0420 04/25/13 0432  HGB 11.8* 10.9* 10.9*    Recent Labs  04/24/13 0420 04/25/13 0432  WBC 6.8 8.3  RBC 3.50* 3.54*  HCT 31.3* 31.2*  PLT 156 194    Recent Labs  04/23/13 0530  NA 136  K 3.3*  CL 99  CO2 29  BUN 7  CREATININE 0.67  GLUCOSE 135*  CALCIUM 8.9    Recent Labs  04/24/13 0420 04/25/13 0432  INR 1.30 1.31    Assessment/Plan: Plan dc today - incision ok   DEAN,GREGORY SCOTT 04/25/2013, 7:05 AM

## 2013-04-29 NOTE — Discharge Summary (Signed)
Physician Discharge Summary  Patient ID: Rhonda Wells MRN: [id number] DOB/AGE: 11/16/1955 57 y.o.  Admit date: 04/22/2013 Discharge date: 04/25/2013  Admission Diagnoses:  Right knee arthritis  Discharge Diagnoses:  Same  Surgeries: Procedure(s): TOTAL KNEE ARTHROPLASTY on 04/22/2013   Consultants:    Discharged Condition: Stable  Hospital Course: Rhonda Wells is an 57 y.o. female who was admitted 04/22/2013 with a chief complaint of right knee pain, and found to have a diagnosis of right knee arthritis.  They were brought to the operating room on 04/22/2013 and underwent the above named procedures. She tolerated the procedure well and was ambulating with PT by the time of dc    Antibiotics given:  Anti-infectives   Start     Dose/Rate Route Frequency Ordered Stop   04/22/13 1430  ceFAZolin (ANCEF) IVPB 1 g/50 mL premix     1 g 100 mL/hr over 30 Minutes Intravenous Every 6 hours 04/22/13 1336 04/23/13 0229   04/22/13 0600  ceFAZolin (ANCEF) IVPB 2 g/50 mL premix     2 g 100 mL/hr over 30 Minutes Intravenous On call to O.R. 04/21/13 1409 04/22/13 0730    .  Recent vital signs:  Filed Vitals:   04/24/13 2011  BP: 121/79  Pulse: 98  Temp: 98.8 F (37.1 C)  Resp: 16    Recent laboratory studies:  Results for orders placed during the hospital encounter of 04/22/13  PROTIME-INR      Result Value Range   Prothrombin Time 13.7  11.6 - 15.2 seconds   INR 1.06  0.00 - 1.49  CBC      Result Value Range   WBC 6.7  4.0 - 10.5 K/uL   RBC 3.83 (*) 3.87 - 5.11 MIL/uL   Hemoglobin 11.8 (*) 12.0 - 15.0 g/dL   HCT 40.9 (*) 81.1 - 91.4 %   MCV 89.6  78.0 - 100.0 fL   MCH 30.8  26.0 - 34.0 pg   MCHC 34.4  30.0 - 36.0 g/dL   RDW 78.2  95.6 - 21.3 %   Platelets 179  150 - 400 K/uL  BASIC METABOLIC PANEL      Result Value Range   Sodium 136  135 - 145 mEq/L   Potassium 3.3 (*) 3.5 - 5.1 mEq/L   Chloride 99  96 - 112 mEq/L   CO2 29  19 - 32 mEq/L   Glucose, Bld 135 (*) 70 -  99 mg/dL   BUN 7  6 - 23 mg/dL   Creatinine, Ser 0.86  0.50 - 1.10 mg/dL   Calcium 8.9  8.4 - 57.8 mg/dL   GFR calc non Af Amer >90  >90 mL/min   GFR calc Af Amer >90  >90 mL/min  PROTIME-INR      Result Value Range   Prothrombin Time 15.9 (*) 11.6 - 15.2 seconds   INR 1.30  0.00 - 1.49  CBC      Result Value Range   WBC 6.8  4.0 - 10.5 K/uL   RBC 3.50 (*) 3.87 - 5.11 MIL/uL   Hemoglobin 10.9 (*) 12.0 - 15.0 g/dL   HCT 46.9 (*) 62.9 - 52.8 %   MCV 89.4  78.0 - 100.0 fL   MCH 31.1  26.0 - 34.0 pg   MCHC 34.8  30.0 - 36.0 g/dL   RDW 41.3  24.4 - 01.0 %   Platelets 156  150 - 400 K/uL  PROTIME-INR      Result Value Range  Prothrombin Time 16.0 (*) 11.6 - 15.2 seconds   INR 1.31  0.00 - 1.49  CBC      Result Value Range   WBC 8.3  4.0 - 10.5 K/uL   RBC 3.54 (*) 3.87 - 5.11 MIL/uL   Hemoglobin 10.9 (*) 12.0 - 15.0 g/dL   HCT 40.9 (*) 81.1 - 91.4 %   MCV 88.1  78.0 - 100.0 fL   MCH 30.8  26.0 - 34.0 pg   MCHC 34.9  30.0 - 36.0 g/dL   RDW 78.2  95.6 - 21.3 %   Platelets 194  150 - 400 K/uL    Discharge Medications:     Medication List    TAKE these medications       aspirin 81 MG chewable tablet  Chew 81 mg by mouth daily.     DSS 100 MG Caps  Take 100 mg by mouth 2 (two) times daily.     ferrous sulfate 325 (65 FE) MG tablet  Take 325 mg by mouth daily with breakfast.     Garlic 1000 MG Caps  Take 1 capsule by mouth daily.     loratadine 10 MG tablet  Commonly known as:  CLARITIN  Take 10 mg by mouth daily as needed for allergies. For allergy symptoms     magnesium citrate Soln  Take 0.5 Bottles by mouth daily as needed. For constipation     methocarbamol 500 MG tablet  Commonly known as:  ROBAXIN  Take 1 tablet (500 mg total) by mouth every 6 (six) hours as needed.     OVER THE COUNTER MEDICATION  Take 1 tablet by mouth daily as needed. Medication-Curamin  As needed for arthritis pain     oxyCODONE 5 MG immediate release tablet  Commonly known as:   Oxy IR/ROXICODONE  Take 1-2 tablets (5-10 mg total) by mouth every 3 (three) hours as needed.     traMADol 50 MG tablet  Commonly known as:  ULTRAM  Take 50 mg by mouth every 6 (six) hours as needed for pain.     triamterene-hydrochlorothiazide 75-50 MG per tablet  Commonly known as:  MAXZIDE  Take 0.5 tablets by mouth daily.     Vitamin D3 400 UNITS Caps  Take 1 capsule by mouth daily.     warfarin 5 MG tablet  Commonly known as:  COUMADIN  Take 1 tablet (5 mg total) by mouth daily.        Diagnostic Studies: Dg Chest 2 View  04/16/2013   *RADIOLOGY REPORT*  Clinical Data: Hypertension. Shortness of breath.  Preoperative for total knee arthroplasty.  CHEST - 2 VIEW  Comparison: 12/05/2010  Findings: Thoracic spondylosis noted.  There is atherosclerotic calcification in the aortic arch.  Heart size within normal limits.  Lungs appear clear.  No pleural effusion observed.  IMPRESSION:  1.  Thoracic spondylosis. 2.  Atherosclerosis. 3.  No acute findings.   Original Report Authenticated By: Gaylyn Rong, M.D.   Dg Knee 1-2 Views Right  04/16/2013   *RADIOLOGY REPORT*  Clinical Data: Osteoarthritis  RIGHT KNEE - 1-2 VIEW  Comparison: None.  Findings: Two views of the right knee submitted.  No acute fracture or subluxation.  There is narrowing of medial joint compartment. Spurring of the medial femoral condyle and medial tibial plateau. Narrowing of patellofemoral joint space.  Small joint effusion.  IMPRESSION: No acute fracture or subluxation.  Osteoarthritic degenerative changes as described above.   Original Report Authenticated By: Natasha Mead,  M.D.    Disposition: 01-Home or Self Care      Discharge Orders   Future Orders Complete By Expires     Arthritis Panel  As directed     Call MD / Call 911  As directed     Comments:      If you experience chest pain or shortness of breath, CALL 911 and be transported to the hospital emergency room.  If you develope a fever above 101  F, pus (white drainage) or increased drainage or redness at the wound, or calf pain, call your surgeon's office.    Constipation Prevention  As directed     Comments:      Drink plenty of fluids.  Prune juice may be helpful.  You may use a stool softener, such as Colace (over the counter) 100 mg twice a day.  Use MiraLax (over the counter) for constipation as needed.    Diet - low sodium heart healthy  As directed     Discharge instructions  As directed     Comments:      1. Weight bearing as tolerated with crutches 2. CPM machine daily 6 hours per day advance degrees daily 3. Keep incision dry    Increase activity slowly as tolerated  As directed           Signed: DEAN,GREGORY SCOTT 04/29/2013, 3:19 PM

## 2013-07-25 ENCOUNTER — Encounter (HOSPITAL_COMMUNITY): Payer: Self-pay | Admitting: *Deleted

## 2013-07-25 ENCOUNTER — Emergency Department (HOSPITAL_COMMUNITY)
Admission: EM | Admit: 2013-07-25 | Discharge: 2013-07-25 | Disposition: A | Discharge status: Home / Self Care | Payer: No Typology Code available for payment source | Attending: Emergency Medicine | Admitting: Emergency Medicine

## 2013-07-25 ENCOUNTER — Emergency Department (INDEPENDENT_AMBULATORY_CARE_PROVIDER_SITE_OTHER)
Admission: EM | Admit: 2013-07-25 | Discharge: 2013-07-25 | Disposition: A | Discharge status: Nursing Home (Includes ICF) | Payer: No Typology Code available for payment source | Source: Home / Self Care

## 2013-07-25 ENCOUNTER — Emergency Department (HOSPITAL_COMMUNITY): Payer: No Typology Code available for payment source

## 2013-07-25 DIAGNOSIS — D649 Anemia, unspecified: Secondary | ICD-10-CM | POA: Insufficient documentation

## 2013-07-25 DIAGNOSIS — Z8639 Personal history of other endocrine, nutritional and metabolic disease: Secondary | ICD-10-CM | POA: Insufficient documentation

## 2013-07-25 DIAGNOSIS — M129 Arthropathy, unspecified: Secondary | ICD-10-CM | POA: Insufficient documentation

## 2013-07-25 DIAGNOSIS — R109 Unspecified abdominal pain: Secondary | ICD-10-CM

## 2013-07-25 DIAGNOSIS — Z862 Personal history of diseases of the blood and blood-forming organs and certain disorders involving the immune mechanism: Secondary | ICD-10-CM | POA: Insufficient documentation

## 2013-07-25 DIAGNOSIS — Z79899 Other long term (current) drug therapy: Secondary | ICD-10-CM | POA: Insufficient documentation

## 2013-07-25 DIAGNOSIS — Z7982 Long term (current) use of aspirin: Secondary | ICD-10-CM | POA: Insufficient documentation

## 2013-07-25 DIAGNOSIS — Z8673 Personal history of transient ischemic attack (TIA), and cerebral infarction without residual deficits: Secondary | ICD-10-CM | POA: Insufficient documentation

## 2013-07-25 DIAGNOSIS — I10 Essential (primary) hypertension: Secondary | ICD-10-CM | POA: Insufficient documentation

## 2013-07-25 LAB — CBC WITH DIFFERENTIAL/PLATELET
Eosinophils Relative: 1 % (ref 0–5)
HCT: 36.7 % (ref 36.0–46.0)
Hemoglobin: 12.9 g/dL (ref 12.0–15.0)
Lymphocytes Relative: 15 % (ref 12–46)
Lymphs Abs: 1.1 10*3/uL (ref 0.7–4.0)
MCV: 87.6 fL (ref 78.0–100.0)
Monocytes Absolute: 0.9 10*3/uL (ref 0.1–1.0)
Monocytes Relative: 12 % (ref 3–12)
Neutro Abs: 5.2 10*3/uL (ref 1.7–7.7)
RBC: 4.19 MIL/uL (ref 3.87–5.11)
RDW: 13.4 % (ref 11.5–15.5)
WBC: 7.3 10*3/uL (ref 4.0–10.5)

## 2013-07-25 LAB — COMPREHENSIVE METABOLIC PANEL
BUN: 9 mg/dL (ref 6–23)
CO2: 30 mEq/L (ref 19–32)
Calcium: 9 mg/dL (ref 8.4–10.5)
Chloride: 104 mEq/L (ref 96–112)
Creatinine, Ser: 0.69 mg/dL (ref 0.50–1.10)
GFR calc Af Amer: >90 mL/min (ref >90)
GFR calc non Af Amer: >90 mL/min (ref >90)
Glucose, Bld: 92 mg/dL (ref 70–99)
Total Bilirubin: 0.5 mg/dL (ref 0.3–1.2)

## 2013-07-25 LAB — POCT URINALYSIS DIP (DEVICE)
Ketones, ur: NEGATIVE mg/dL
Leukocytes, UA: NEGATIVE
Nitrite: NEGATIVE
Protein, ur: NEGATIVE mg/dL
Urobilinogen, UA: 0.2 mg/dL (ref 0.0–1.0)
pH: 7 (ref 5.0–8.0)

## 2013-07-25 NOTE — ED Provider Notes (Signed)
CSN: [id number]     Arrival date & time 07/25/13  0940 History   None    Chief Complaint  Patient presents with  . Abdominal Pain   Patient is a 57 y.o. female presenting with abdominal pain. The history is provided by the patient.  Abdominal Pain This is a recurrent problem. The current episode started 6 to 12 hours ago. The problem occurs constantly. The problem has not changed since onset.Associated symptoms include abdominal pain. Pertinent negatives include no chest pain, no headaches and no shortness of breath. The symptoms are aggravated by walking and bending. Nothing relieves the symptoms.  Pt reports onset of severe LLQ abd pain last night. Pain is non-radiating. Reports pain last night was 10/10 and she could hardly move or walk around well. Today the pain is 7/10. She denies N/V/D or fever. Pt denies UTI symptoms or vaginal d/c. She does have issues w/ constipation for which she takes Citrate of Magnesium. Had a small BM yesterday. She is post menopausal and remains sexually active w/ same partner. She has had similar pain in the past and took med for "gas" and it helped. But same med last night did not help. Pt has had a colonoscopy and was noted to have polyps but no diverticulum that she can remember being told.   Past Medical History  Diagnosis Date  . Hypertension   . Shortness of breath     hx fluid in lungs  20's  . Anemia   . Hypercholesteremia   . Sickle cell trait   . Stroke ~ 2003    tia   . Arthritis     "right knee; fingers" (04/23/2013)   Past Surgical History  Procedure Laterality Date  . Cesarean section  1976; 1980; 1982  . Anal fissurectomy  ?2011  . 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 No pertinent family history. History   Substance Use Topics  . Smoking status: Never Smoker   . Smokeless tobacco: Never Used  . Alcohol Use: No   OB History   Grav Para Term Preterm Abortions TAB SAB Ect Mult Living                 Review of Systems  Respiratory: Negative for shortness of breath.   Cardiovascular: Negative for chest pain.  Gastrointestinal: Positive for abdominal pain.  Neurological: Negative for headaches.    Allergies  Review of patient's allergies indicates no known allergies.  Home Medications   Current Outpatient Rx  Name  Route  Sig  Dispense  Refill  . aspirin 81 MG chewable tablet   Oral   Chew 81 mg by mouth daily.         . methocarbamol (ROBAXIN) 500 MG tablet   Oral   Take 500 mg by mouth 3 (three) times daily.         Marland Kitchen triamterene-hydrochlorothiazide (MAXZIDE) 75-50 MG per tablet   Oral   Take 0.5 tablets by mouth daily.         . Cholecalciferol (VITAMIN D3) 400 UNITS CAPS   Oral   Take 1 capsule by mouth daily.         Marland Kitchen docusate sodium 100 MG CAPS   Oral   Take 100 mg by mouth 2 (two) times  daily.   10 capsule   0   . ferrous sulfate 325 (65 FE) MG tablet   Oral   Take 325 mg by mouth daily with breakfast.         . Garlic 1000 MG CAPS   Oral   Take 1 capsule by mouth daily.         Marland Kitchen loratadine (CLARITIN) 10 MG tablet   Oral   Take 10 mg by mouth daily as needed for allergies. For allergy symptoms         . magnesium citrate SOLN   Oral   Take 0.5 Bottles by mouth daily as needed. For constipation         . OVER THE COUNTER MEDICATION   Oral   Take 1 tablet by mouth daily as needed. Medication-Curamin As needed for arthritis pain         . oxyCODONE (OXY IR/ROXICODONE) 5 MG immediate release tablet   Oral   Take 1-2 tablets (5-10 mg total) by mouth every 3 (three) hours as needed.   60 tablet   0   . traMADol (ULTRAM) 50 MG tablet   Oral   Take 50 mg by mouth every 6 (six) hours as needed for pain.         Marland Kitchen warfarin  (COUMADIN) 5 MG tablet   Oral   Take 1 tablet (5 mg total) by mouth daily.   30 tablet   1    BP 139/79  Pulse 89  Temp(Src) 98.5 F (36.9 C) (Oral)  SpO2 96% Physical Exam  Abdominal: Soft. Bowel sounds are normal. There is tenderness. There is no rebound.  Abd is otherwise soft, non-distended w/ hypoactive bs in lower quads and normal bs in upper quads.     ED Course  Procedures (including critical care time) Labs Review Labs Reviewed  POCT URINALYSIS DIP (DEVICE)   Imaging Review No results found.  MDM   Pt reports onset of severe LLQ abd pain last night. Pain is non-radiating. Reports pain last night was 10/10 and she could hardly move or walk around well. Today the pain is 7/10. She denies N/V/D or fever. Pt denies UTI symptoms or vaginal d/c. She is post menopausal and remains sexually active w/ same partner. She has had similar pain in the past and took med for "gas" and it helped. But same med last night did not help. Pt has had a colonoscopy and was noted to have polyps but no diverticulum that she can remember being told. She is very TTP in the LLQ but admittedly is otherwise not ill appearing. Discussed pt w/ Dr Artis Flock who feels pt should be sent to ED for further eval and possible abd ct to r/o diverticulitis. Discussed w/ pt who is agreeable.   Leanne Chang, NP 07/25/13 1104

## 2013-07-25 NOTE — ED Provider Notes (Signed)
Complains of left-sided abdominal pain onset last night. She is presently hungry, denies fever. Pain is minimal at present feels much improved she passed gas per rectum. On exam she is alert no distress abdomen soft minimally tender at left lower quadrant no guarding rigidity or rebound.  Doug Sou, MD 07/25/13 2127

## 2013-07-25 NOTE — ED Notes (Signed)
Pt complains of sharp abd pain 6/10 in LLQ.  Pt states she tries GAS-X last night before bed. Pt denies any nausea, vomiting, fever, constipation, or diarrhea.

## 2013-07-25 NOTE — ED Notes (Signed)
Pt sent here by [facility] for r/o diverticulitis.  Acute onset LLQ pain last night.  No changes in bowel/bladder habits or vaginal d/c.  UA at UC unremarkable.

## 2013-07-25 NOTE — ED Notes (Signed)
Pt stated she gets "antsy" when she's in a place for a long time.  Pt stated they told her at urgent care that she needed a CT scan.  Informed pt I would check with provider and give her an update.

## 2013-07-25 NOTE — ED Provider Notes (Addendum)
CSN: [id number]     Arrival date & time 07/25/13  1108 History   First MD Initiated Contact with Patient 07/25/13 1209     Chief Complaint  Patient presents with  . Abdominal Pain    UC transfer   (Consider location/radiation/quality/duration/timing/severity/associated sxs/prior Treatment) Patient is a 57 y.o. female presenting with abdominal pain. The history is provided by the patient. No language interpreter was used.  Abdominal Pain Pain location:  LLQ Pain quality: aching and sharp   Pain radiates to:  Does not radiate Pain severity:  Moderate Onset quality:  Sudden Associated symptoms: no diarrhea, no dysuria, no fever, no nausea, no vaginal bleeding and no vomiting   Associated symptoms comment:  Patient arrives from Urgent Care for further evaluation of abdominal pain that started during the night. No N, V. No change in her bowel movements. No blood seen with bowel movements. She states the pain is worse when she is active. Her last colonoscopy with significant for polyps only, per patient. No fever at any time.    Past Medical History  Diagnosis Date  . Hypertension   . Shortness of breath     hx fluid in lungs  20's  . Anemia   . Hypercholesteremia   . Sickle cell trait   . Stroke ~ 2003    tia   . Arthritis     "right knee; fingers" (04/23/2013)   Past Surgical History  Procedure Laterality Date  . Cesarean section  1976; 1980; 1982  . Anal fissurectomy  ?2011  . 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 History  Substance Use Topics  . Smoking status: Never Smoker   . Smokeless tobacco: Never Used  . Alcohol Use: No   OB History   Grav Para Term Preterm Abortions TAB SAB Ect Mult Living                 Review of Systems   Constitutional: Negative for fever.  Respiratory: Negative.   Cardiovascular: Negative.   Gastrointestinal: Positive for abdominal pain. Negative for nausea, vomiting, diarrhea and blood in stool.  Genitourinary: Negative.  Negative for dysuria, flank pain and vaginal bleeding.  Musculoskeletal: Negative.  Negative for myalgias.  Skin: Negative.  Negative for color change and rash.  Neurological: Negative.  Negative for weakness.    Allergies  Review of patient's allergies indicates no known allergies.  Home Medications   Current Outpatient Rx  Name  Route  Sig  Dispense  Refill  . aspirin 81 MG chewable tablet   Oral   Chew 81 mg by mouth daily.         . Cholecalciferol (VITAMIN D3) 400 UNITS CAPS   Oral   Take 1 capsule by mouth daily.         . ferrous sulfate 325 (65 FE) MG tablet   Oral   Take 325 mg by mouth daily with breakfast.         . Garlic 1000 MG CAPS   Oral   Take 1 capsule by mouth daily.         . magnesium citrate SOLN   Oral   Take 0.5 Bottles by mouth daily as needed. For constipation         .  methocarbamol (ROBAXIN) 500 MG tablet   Oral   Take 500 mg by mouth 3 (three) times daily.         Marland Kitchen triamterene-hydrochlorothiazide (MAXZIDE) 75-50 MG per tablet   Oral   Take 0.5 tablets by mouth daily.          BP 122/79  Pulse 71  Temp(Src) 98.4 F (36.9 C) (Oral)  Resp 16  SpO2 99% Physical Exam  Constitutional: She is oriented to person, place, and time. She appears well-developed and well-nourished.  HENT:  Head: Normocephalic.  Neck: Normal range of motion. Neck supple.  Cardiovascular: Normal rate and regular rhythm.   Pulmonary/Chest: Effort normal and breath sounds normal.  Abdominal: Soft. Bowel sounds are normal. There is tenderness. There is no rebound and no guarding.  Mild tenderness diffusely with greater tenderness in LLQ.  Musculoskeletal: Normal range of motion.  Neurological: She is alert and oriented to  person, place, and time.  Skin: Skin is warm and dry. No rash noted.  Psychiatric: She has a normal mood and affect.    ED Course  Procedures (including critical care time) Labs Review Labs Reviewed  URINE CULTURE  CBC WITH DIFFERENTIAL  COMPREHENSIVE METABOLIC PANEL  LIPASE, BLOOD  URINALYSIS, ROUTINE W REFLEX MICROSCOPIC   Results for orders placed during the hospital encounter of 07/25/13  CBC WITH DIFFERENTIAL      Result Value Range   WBC 7.3  4.0 - 10.5 K/uL   RBC 4.19  3.87 - 5.11 MIL/uL   Hemoglobin 12.9  12.0 - 15.0 g/dL   HCT 04.5  40.9 - 81.1 %   MCV 87.6  78.0 - 100.0 fL   MCH 30.8  26.0 - 34.0 pg   MCHC 35.1  30.0 - 36.0 g/dL   RDW 91.4  78.2 - 95.6 %   Platelets 217  150 - 400 K/uL   Neutrophils Relative % 72  43 - 77 %   Neutro Abs 5.2  1.7 - 7.7 K/uL   Lymphocytes Relative 15  12 - 46 %   Lymphs Abs 1.1  0.7 - 4.0 K/uL   Monocytes Relative 12  3 - 12 %   Monocytes Absolute 0.9  0.1 - 1.0 K/uL   Eosinophils Relative 1  0 - 5 %   Eosinophils Absolute 0.1  0.0 - 0.7 K/uL   Basophils Relative 0  0 - 1 %   Basophils Absolute 0.0  0.0 - 0.1 K/uL  COMPREHENSIVE METABOLIC PANEL      Result Value Range   Sodium 141  135 - 145 mEq/L   Potassium 3.6  3.5 - 5.1 mEq/L   Chloride 104  96 - 112 mEq/L   CO2 30  19 - 32 mEq/L   Glucose, Bld 92  70 - 99 mg/dL   BUN 9  6 - 23 mg/dL   Creatinine, Ser 2.13  0.50 - 1.10 mg/dL   Calcium 9.0  8.4 - 08.6 mg/dL   Total Protein 7.1  6.0 - 8.3 g/dL   Albumin 3.7  3.5 - 5.2 g/dL   AST 14  0 - 37 U/L   ALT 9  0 - 35 U/L   Alkaline Phosphatase 69  39 - 117 U/L   Total Bilirubin 0.5  0.3 - 1.2 mg/dL   GFR calc non Af Amer >90  >90 mL/min   GFR calc Af Amer >90  >90 mL/min  LIPASE, BLOOD      Result Value Range  Lipase 25  11 - 59 U/L   Dg Abd Acute W/chest  07/25/2013   *RADIOLOGY REPORT*  Clinical Data: Left lower abdominal pain, initial encounter.  ACUTE ABDOMEN SERIES (ABDOMEN 2 VIEW & CHEST 1 VIEW)  Comparison: Chest  radiograph - 04/06/2013  Findings:  Grossly unchanged cardiac silhouette and mediastinal contours.  No focal airspace opacity.  No pleural effusion or pneumothorax.  Paucity of bowel gas without definite evidence of obstruction.  No pneumoperitoneum, pneumatosis or portal venous gas.  Multiple phleboliths overlie the lower pelvis.  An additional calcification overlying the peripheral aspect of the right mid hemiabdomen and may be dermal in etiology.  Multilevel thoracolumbar spine degenerative change.  IMPRESSION: 1.  No acute cardiopulmonary disease. 2.  Nonobstructive bowel gas pattern.   Original Report Authenticated By: Tacey Ruiz, MD   Imaging Review No results found.  MDM  No diagnosis found. 1. Abdominal pain 2.   She reports feeling significantly better since passing flatus. Labs/x-rays negative. Stable for discharge. Dr. Ethelda Chick has seen and evaluated the patient and agrees with care plan.    Arnoldo Hooker, PA-C 07/25/13 1513  Arnoldo Hooker, PA-C 09/02/13 [phone]

## 2013-07-25 NOTE — ED Provider Notes (Signed)
Medical screening examination/treatment/procedure(s) were conducted as a shared visit with non-physician practitioner(s) and myself.  I personally evaluated the patient during the encounter  Doug Sou, MD 07/25/13 2128

## 2013-07-30 NOTE — ED Provider Notes (Signed)
Medical screening examination/treatment/procedure(s) were performed by resident physician or non-physician practitioner and as supervising physician I was immediately available for consultation/collaboration.   Deago Burruss DOUGLAS MD.   Jalynne Persico D Marvin Maenza, MD 07/30/13 1450 

## 2013-09-02 NOTE — ED Provider Notes (Signed)
Medical screening examination/treatment/procedure(s) were conducted as a shared visit with non-physician practitioner(s) and myself.  I personally evaluated the patient during the encounter.  EKG Interpretation   None        Jemel Ono, MD 09/02/13 1227 

## 2013-11-11 ENCOUNTER — Emergency Department (HOSPITAL_COMMUNITY)
Admission: EM | Admit: 2013-11-11 | Discharge: 2013-11-11 | Disposition: A | Discharge status: Home / Self Care | Payer: No Typology Code available for payment source | Source: Home / Self Care | Attending: Family Medicine | Admitting: Family Medicine

## 2013-11-11 ENCOUNTER — Encounter (HOSPITAL_COMMUNITY): Payer: Self-pay | Admitting: Emergency Medicine

## 2013-11-11 DIAGNOSIS — M25519 Pain in unspecified shoulder: Secondary | ICD-10-CM

## 2013-11-11 DIAGNOSIS — M25511 Pain in right shoulder: Secondary | ICD-10-CM

## 2013-11-11 NOTE — ED Notes (Signed)
Knot in R shoulder onset this AM. She noted it when she got out of the shower.  It is not real painful.  Hurts a little bit. Has arthritic joints in her fingers.

## 2013-11-11 NOTE — Discharge Instructions (Signed)
Use ice and advil if needed and see dr dean for further eval.

## 2013-11-11 NOTE — ED Provider Notes (Addendum)
CSN: [id number]     Arrival date & time 11/11/13  1934 History   None    Chief Complaint  Patient presents with  . Shoulder Pain   (Consider location/radiation/quality/duration/timing/severity/associated sxs/prior Treatment) Patient is a 58 y.o. female presenting with shoulder pain. The history is provided by the patient.  Shoulder Pain This is a new problem. The current episode started 12 to 24 hours ago (noticed hard knot on right shoulder this am, not previously.). The problem has not changed since onset.Pertinent negatives include no chest pain and no abdominal pain.    Past Medical History  Diagnosis Date  . Hypertension   . Shortness of breath     hx fluid in lungs  20's  . Anemia   . Hypercholesteremia   . Sickle cell trait   . Stroke ~ 2003    tia   . Arthritis     "right knee; fingers" (04/23/2013)   Past Surgical History  Procedure Laterality Date  . Cesarean section  1976; 1980; 1982  . Anal fissurectomy  ?2011  . 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 Total knee arthroplasty Right 04/22/2013    Procedure: TOTAL KNEE ARTHROPLASTY;  Surgeon: Cammy Copa, MD;  Location: [facility] OR;  Service: Orthopedics;  Laterality: Right;  Right Total Knee Arthroplasty   Family History  Problem Relation Age of Onset  . Hypertension Mother   . Diabetes Mother   . Alzheimer's disease Father   . Cancer Father     Prostate   History  Substance Use Topics  . Smoking status: Never Smoker   . Smokeless tobacco: Never Used  . Alcohol Use: No   OB History   Grav Para Term Preterm Abortions TAB SAB Ect Mult Living                 Review of Systems  Constitutional: Negative.   Cardiovascular: Negative for chest pain.  Gastrointestinal: Negative for abdominal pain.  Musculoskeletal: Negative for joint swelling and neck pain.  Skin: Negative.     Allergies  Review of patient's allergies indicates no known allergies.  Home Medications   Current Outpatient Rx  Name   Route  Sig  Dispense  Refill  . aspirin 81 MG chewable tablet   Oral   Chew 81 mg by mouth daily.         . Cholecalciferol (VITAMIN D3) 400 UNITS CAPS   Oral   Take 1 capsule by mouth daily.         . ferrous sulfate 325 (65 FE) MG tablet   Oral   Take 325 mg by mouth daily with breakfast.         . Garlic 1000 MG CAPS   Oral   Take 1 capsule by mouth daily.         . magnesium citrate SOLN   Oral   Take 0.5 Bottles by mouth daily as needed. For constipation         . triamterene-hydrochlorothiazide (MAXZIDE) 75-50 MG per tablet   Oral   Take 0.5 tablets by mouth daily.         . methocarbamol (ROBAXIN) 500 MG tablet   Oral   Take 500 mg by mouth 3 (three) times daily.          BP 143/98  Pulse 68  Temp(Src) 97.8 F (36.6 C) (Oral)  Resp 20  SpO2 100% Physical Exam  Nursing note and vitals reviewed. Constitutional: She is oriented to person, place,  and time. She appears well-developed and well-nourished.  Musculoskeletal: Normal range of motion. She exhibits tenderness.       Right shoulder: She exhibits tenderness, bony tenderness and swelling. She exhibits normal range of motion.       Arms: Neurological: She is alert and oriented to person, place, and time.  Skin: Skin is dry.    ED Course  Procedures (including critical care time) Labs Review Labs Reviewed - No data to display Imaging Review No results found.  EKG Interpretation    Date/Time:    Ventricular Rate:    PR Interval:    QRS Duration:   QT Interval:    QTC Calculation:   R Axis:     Text Interpretation:              MDM      Linna HoffJames D Lyanne Kates, MD 11/11/13 2056  Linna HoffJames D Nocholas Damaso, MD 11/14/13 [phone]

## 2014-03-04 ENCOUNTER — Encounter (HOSPITAL_COMMUNITY): Payer: Self-pay | Admitting: Emergency Medicine

## 2014-03-04 ENCOUNTER — Emergency Department (HOSPITAL_COMMUNITY)
Admission: EM | Admit: 2014-03-04 | Discharge: 2014-03-04 | Disposition: A | Discharge status: Home / Self Care | Payer: No Typology Code available for payment source | Source: Home / Self Care | Attending: Family Medicine | Admitting: Family Medicine

## 2014-03-04 DIAGNOSIS — R319 Hematuria, unspecified: Secondary | ICD-10-CM

## 2014-03-04 LAB — POCT I-STAT, CHEM 8
BUN: 14 mg/dL (ref 6–23)
CREATININE: 0.8 mg/dL (ref 0.50–1.10)
Calcium, Ion: 1.26 mmol/L — ABNORMAL HIGH (ref 1.12–1.23)
Chloride: 101 mEq/L (ref 96–112)
Glucose, Bld: 90 mg/dL (ref 70–99)
HCT: 43 % (ref 36.0–46.0)
Hemoglobin: 14.6 g/dL (ref 12.0–15.0)
Potassium: 3.9 mEq/L (ref 3.7–5.3)
SODIUM: 141 meq/L (ref 137–147)
TCO2: 27 mmol/L (ref 0–100)

## 2014-03-04 LAB — POCT URINALYSIS DIP (DEVICE)
Bilirubin Urine: NEGATIVE
Glucose, UA: NEGATIVE mg/dL
KETONES UR: NEGATIVE mg/dL
Nitrite: NEGATIVE
PH: 7 (ref 5.0–8.0)
PROTEIN: NEGATIVE mg/dL
SPECIFIC GRAVITY, URINE: 1.015 (ref 1.005–1.030)
Urobilinogen, UA: 0.2 mg/dL (ref 0.0–1.0)

## 2014-03-04 MED ORDER — CEPHALEXIN 500 MG PO CAPS: 500.0000 mg | ORAL_CAPSULE | Freq: Two times a day (BID) | ORAL | Status: DC

## 2014-03-04 NOTE — ED Provider Notes (Signed)
Rhonda Wells is a 58 y.o. female who presents to Urgent Care today for blood in urine. Symptoms started yesterday. Patient denies any significant abdominal pain or dysuria urinary frequency urgency. Symptoms seem to be improving a bit. No nausea vomiting or diarrhea.   Past Medical History  Diagnosis Date  . Hypertension   . Shortness of breath     hx fluid in lungs  20's  . Anemia   . Hypercholesteremia   . Sickle cell trait   . Stroke ~ 2003    tia   . Arthritis     "right knee; fingers" (04/23/2013)   History  Substance Use Topics  . Smoking status: Never Smoker   . Smokeless tobacco: Never Used  . Alcohol Use: No   ROS as above Medications: No current facility-administered medications for this encounter.   Current Outpatient Prescriptions  Medication Sig Dispense Refill  . aspirin 81 MG chewable tablet Chew 81 mg by mouth daily.      Marland Kitchen. triamterene-hydrochlorothiazide (MAXZIDE) 75-50 MG per tablet Take 0.5 tablets by mouth daily.      . cephALEXin (KEFLEX) 500 MG capsule Take 1 capsule (500 mg total) by mouth 2 (two) times daily.  14 capsule  0  . Cholecalciferol (VITAMIN D3) 400 UNITS CAPS Take 1 capsule by mouth daily.      . ferrous sulfate 325 (65 FE) MG tablet Take 325 mg by mouth daily with breakfast.      . Garlic 1000 MG CAPS Take 1 capsule by mouth daily.      . magnesium citrate SOLN Take 0.5 Bottles by mouth daily as needed. For constipation      . methocarbamol (ROBAXIN) 500 MG tablet Take 500 mg by mouth 3 (three) times daily.        Exam:  BP 139/99  Pulse 68  Temp(Src) 98.3 F (36.8 C) (Oral)  Resp 18  SpO2 100% Gen: Well NAD HEENT: EOMI,  MMM Lungs: Normal work of breathing. CTABL Heart: RRR no MRG Abd: NABS, Soft. NT, ND no CVA tenderness to percussion Exts: Brisk capillary refill, warm and well perfused.   Results for orders placed during the hospital encounter of 03/04/14 (from the past 24 hour(s))  POCT URINALYSIS DIP (DEVICE)     Status:  Abnormal   Collection Time    03/04/14  9:00 PM      Result Value Ref Range   Glucose, UA NEGATIVE  NEGATIVE mg/dL   Bilirubin Urine NEGATIVE  NEGATIVE   Ketones, ur NEGATIVE  NEGATIVE mg/dL   Specific Gravity, Urine 1.015  1.005 - 1.030   Hgb urine dipstick LARGE (*) NEGATIVE   pH 7.0  5.0 - 8.0   Protein, ur NEGATIVE  NEGATIVE mg/dL   Urobilinogen, UA 0.2  0.0 - 1.0 mg/dL   Nitrite NEGATIVE  NEGATIVE   Leukocytes, UA TRACE (*) NEGATIVE  POCT I-STAT, CHEM 8     Status: Abnormal   Collection Time    03/04/14  9:08 PM      Result Value Ref Range   Sodium 141  137 - 147 mEq/L   Potassium 3.9  3.7 - 5.3 mEq/L   Chloride 101  96 - 112 mEq/L   BUN 14  6 - 23 mg/dL   Creatinine, Ser 4.090.80  0.50 - 1.10 mg/dL   Glucose, Bld 90  70 - 99 mg/dL   Calcium, Ion 8.111.26 (*) 1.12 - 1.23 mmol/L   TCO2 27  0 - 100 mmol/L  Hemoglobin 14.6  12.0 - 15.0 g/dL   HCT 40.943.0  81.136.0 - 91.446.0 %   No results found.  Assessment and Plan: 58 y.o. female with hematuria with trace leukocyte Estrace present. Urine culture pending. Unclear etiology. Possible urinary tract infection. Plan to treat empirically with Keflex. Recommend patient followup with primary care provider for further evaluation. She may need referral to urology  Discussed warning signs or symptoms. Please see discharge instructions. Patient expresses understanding.    Rodolph BongEvan S Fermon Ureta, MD 03/04/14 2130

## 2014-03-04 NOTE — Discharge Instructions (Signed)
Thank you for coming in today. Followup with your primary care Dr. Zachery Conchake Keflex twice daily for one week Return as needed If your belly pain worsens, or you have high fever, bad vomiting, blood in your stool or black tarry stool go to the Emergency Room.    Hematuria, Adult Hematuria is blood in your urine. It can be caused by a bladder infection, kidney infection, prostate infection, kidney stone, or cancer of your urinary tract. Infections can usually be treated with medicine, and a kidney stone usually will pass through your urine. If neither of these is the cause of your hematuria, further workup to find out the reason may be needed. It is very important that you tell your health care provider about any blood you see in your urine, even if the blood stops without treatment or happens without causing pain. Blood in your urine that happens and then stops and then happens again can be a symptom of a very serious condition. Also, pain is not a symptom in the initial stages of many urinary cancers. HOME CARE INSTRUCTIONS   Drink lots of fluid, 3 4 quarts a day. If you have been diagnosed with an infection, cranberry juice is especially recommended, in addition to large amounts of water.  Avoid caffeine, tea, and carbonated beverages, because they tend to irritate the bladder.  Avoid alcohol because it may irritate the prostate.  Only take over-the-counter or prescription medicines for pain, discomfort, or fever as directed by your health care provider.  If you have been diagnosed with a kidney stone, follow your health care provider's instructions regarding straining your urine to catch the stone.  Empty your bladder often. Avoid holding urine for long periods of time.  After a bowel movement, women should cleanse front to back. Use each tissue only once.  Empty your bladder before and after sexual intercourse if you are a female. SEEK MEDICAL CARE IF: You develop back pain, fever, a  feeling of sickness in your stomach (nausea), or vomiting or if your symptoms are not better in 3 days. Return sooner if you are getting worse. SEEK IMMEDIATE MEDICAL CARE IF:   You have a persistent fever, with a temperature of 101.25F (38.8C) or greater.  You develop severe vomiting and are unable to keep the medicine down.  You develop severe back or abdominal pain despite taking your medicines.  You begin passing a large amount of blood or clots in your urine.  You feel extremely weak or faint, or you pass out. MAKE SURE YOU:   Understand these instructions.  Will watch your condition.  Will get help right away if you are not doing well or get worse. Document Released: 10/23/2005 Document Revised: 08/13/2013 Document Reviewed: 06/23/2013 [facility] Patient Information 2014 [employer], [place].

## 2014-03-04 NOTE — ED Notes (Signed)
Pt c/o hematuria onset yest Denies back/abd pain, bladder problems, f/v/n/d Alert w/no signs of acute distress.

## 2014-03-06 LAB — URINE CULTURE: SPECIAL REQUESTS: NORMAL

## 2014-03-14 ENCOUNTER — Emergency Department (INDEPENDENT_AMBULATORY_CARE_PROVIDER_SITE_OTHER)
Admission: EM | Admit: 2014-03-14 | Discharge: 2014-03-14 | Disposition: A | Discharge status: Home / Self Care | Payer: Self-pay | Source: Home / Self Care | Attending: Emergency Medicine | Admitting: Emergency Medicine

## 2014-03-14 ENCOUNTER — Encounter (HOSPITAL_COMMUNITY): Payer: Self-pay | Admitting: Emergency Medicine

## 2014-03-14 ENCOUNTER — Other Ambulatory Visit (HOSPITAL_COMMUNITY)
Admission: RE | Admit: 2014-03-14 | Discharge: 2014-03-14 | Disposition: A | Discharge status: Home / Self Care | Payer: Self-pay | Source: Ambulatory Visit | Attending: Emergency Medicine | Admitting: Emergency Medicine

## 2014-03-14 DIAGNOSIS — Z113 Encounter for screening for infections with a predominantly sexual mode of transmission: Secondary | ICD-10-CM | POA: Insufficient documentation

## 2014-03-14 DIAGNOSIS — N760 Acute vaginitis: Secondary | ICD-10-CM | POA: Insufficient documentation

## 2014-03-14 DIAGNOSIS — R319 Hematuria, unspecified: Secondary | ICD-10-CM

## 2014-03-14 LAB — POCT URINALYSIS DIP (DEVICE)
Bilirubin Urine: NEGATIVE
Glucose, UA: NEGATIVE mg/dL
KETONES UR: NEGATIVE mg/dL
Nitrite: NEGATIVE
PROTEIN: NEGATIVE mg/dL
Specific Gravity, Urine: 1.015 (ref 1.005–1.030)
UROBILINOGEN UA: 0.2 mg/dL (ref 0.0–1.0)
pH: 6.5 (ref 5.0–8.0)

## 2014-03-14 NOTE — ED Provider Notes (Signed)
Medical screening examination/treatment/procedure(s) were performed by non-physician practitioner and as supervising physician I was immediately available for consultation/collaboration.  Leslee Homeavid Corrigan Kretschmer, M.D.  Reuben Likesavid C Radonna Bracher, MD 03/14/14 [phone]

## 2014-03-14 NOTE — ED Notes (Signed)
Pt c/o blood in her urine onset last Tuesday Also reports white vag d/c  Denies abd/back pain, dysuria, f/v/n/d Seen here on 04/29 for similar sx Alert w/no signs of acute distress.

## 2014-03-14 NOTE — ED Provider Notes (Signed)
CSN: [id number]     Arrival date & time 03/14/14  0905 History   First MD Initiated Contact with Patient 03/14/14 [phone]     Chief Complaint  Patient presents with  . Hematuria  . Vaginal Discharge   (Consider location/radiation/quality/duration/timing/severity/associated sxs/prior Treatment) HPI Comments: 58 year old female presents complaining of hematuria. She has had this since the day before she came last time, 10 days ago, and was treated for a urinary tract infection. She also has had a thin white vaginal discharge since that time. Her hematuria has not resolved since then, nor has the discharge. She has no symptoms of a urinary tract infection. She is sexually active but is monogamous and does not think she would have an STD but was to have that checked. She also notes a recent history of unintended weight loss and early satiety. She denies fever, chills, NVD, dysuria, urinary urgency, urinary frequency.   Past Medical History  Diagnosis Date  . Hypertension   . Shortness of breath     hx fluid in lungs  20's  . Anemia   . Hypercholesteremia   . Sickle cell trait   . Stroke ~ 2003    tia   . Arthritis     "right knee; fingers" (04/23/2013)   Past Surgical History  Procedure Laterality Date  . Cesarean section  1976; 1980; 1982  . Anal fissurectomy  ?2011  . 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 Hypertension Mother   . Diabetes Mother   . Alzheimer's disease Father   . Cancer Father     Prostate   History  Substance Use Topics  . Smoking status: Never Smoker   . Smokeless tobacco: Never Used  . Alcohol Use: No   OB History   Grav Para Term Preterm Abortions TAB SAB Ect Mult Living                 Review of  Systems  Constitutional: Positive for unexpected weight change.  Gastrointestinal:       Early satiety  Genitourinary: Positive for hematuria and vaginal discharge.  All other systems reviewed and are negative.   Allergies  Review of patient's allergies indicates no known allergies.  Home Medications   Prior to Admission medications   Medication Sig Start Date End Date Taking? Authorizing Provider  aspirin 81 MG chewable tablet Chew 81 mg by mouth daily.   Yes Historical Provider, MD  cephALEXin (KEFLEX) 500 MG capsule Take 1 capsule (500 mg total) by mouth 2 (two) times daily. 03/04/14  Yes Rodolph BongEvan S Corey, MD  triamterene-hydrochlorothiazide (MAXZIDE) 75-50 MG per tablet Take 0.5 tablets by mouth daily.   Yes Historical Provider, MD  Cholecalciferol (VITAMIN D3) 400 UNITS CAPS Take 1 capsule by mouth daily.    Historical Provider, MD  ferrous sulfate 325 (65 FE) MG tablet Take 325 mg by mouth daily with breakfast.    Historical Provider, MD  Garlic 1000 MG CAPS Take 1 capsule by mouth daily.    Historical Provider, MD  magnesium citrate SOLN Take 0.5 Bottles by mouth daily as needed. For constipation    Historical Provider, MD  methocarbamol (ROBAXIN) 500 MG tablet Take 500 mg by mouth 3 (three) times daily. 04/24/13  Cammy CopaGregory Scott Dean, MD   BP 136/81  Pulse 67  Temp(Src) 98.6 F (37 C) (Oral)  Resp 16  SpO2 97% Physical Exam  Nursing note and vitals reviewed. Constitutional: She is oriented to person, place, and time. Vital signs are normal. She appears well-developed and well-nourished. No distress.  HENT:  Head: Normocephalic and atraumatic.  Pulmonary/Chest: Effort normal. No respiratory distress.  Abdominal: Soft. Normal appearance. She exhibits no shifting dullness, no fluid wave and no mass. There is no hepatosplenomegaly. There is no tenderness. There is no CVA tenderness. No hernia.  Genitourinary: There is no rash or lesion on the right labia. There is no rash or lesion  on the left labia. Uterus is deviated (To the left). Cervix exhibits no motion tenderness, no discharge and no friability. Right adnexum displays no mass. Left adnexum displays no mass. No erythema, tenderness or bleeding around the vagina. Vaginal discharge (small amount of thin white discharge) found.  Lymphadenopathy:       Right: No inguinal adenopathy present.       Left: No inguinal adenopathy present.  Neurological: She is alert and oriented to person, place, and time. She has normal strength. Coordination normal.  Skin: Skin is warm and dry. No rash noted. She is not diaphoretic.  Psychiatric: She has a normal mood and affect. Judgment normal.    ED Course  Procedures (including critical care time) Labs Review Labs Reviewed  POCT URINALYSIS DIP (DEVICE) - Abnormal; Notable for the following:    Hgb urine dipstick LARGE (*)    Leukocytes, UA TRACE (*)    All other components within normal limits  URINE CULTURE  CERVICOVAGINAL ANCILLARY ONLY    Imaging Review No results found.   MDM   1. Hematuria    Sending labs to check for STDs. However, the painless hematuria with possible accompanied unintentional weight loss, early satiety, and the finding of the deviated uterus is concerning. She needs evaluation of the to rule out malignancy.    Graylon GoodZachary H Colby Catanese, PA-C 03/14/14 1010

## 2014-03-14 NOTE — Discharge Instructions (Signed)
Hematuria, Adult °Hematuria is blood in your urine. It can be caused by a bladder infection, kidney infection, prostate infection, kidney stone, or cancer of your urinary tract. Infections can usually be treated with medicine, and a kidney stone usually will pass through your urine. If neither of these is the cause of your hematuria, further workup to find out the reason may be needed. °It is very important that you tell your health care provider about any blood you see in your urine, even if the blood stops without treatment or happens without causing pain. Blood in your urine that happens and then stops and then happens again can be a symptom of a very serious condition. Also, pain is not a symptom in the initial stages of many urinary cancers. °HOME CARE INSTRUCTIONS  °· Drink lots of fluid, 3 4 quarts a day. If you have been diagnosed with an infection, cranberry juice is especially recommended, in addition to large amounts of water. °· Avoid caffeine, tea, and carbonated beverages, because they tend to irritate the bladder. °· Avoid alcohol because it may irritate the prostate. °· Only take over-the-counter or prescription medicines for pain, discomfort, or fever as directed by your health care provider. °· If you have been diagnosed with a kidney stone, follow your health care provider's instructions regarding straining your urine to catch the stone. °· Empty your bladder often. Avoid holding urine for long periods of time. °· After a bowel movement, women should cleanse front to back. Use each tissue only once. °· Empty your bladder before and after sexual intercourse if you are a female. °SEEK MEDICAL CARE IF: °You develop back pain, fever, a feeling of sickness in your stomach (nausea), or vomiting or if your symptoms are not better in 3 days. Return sooner if you are getting worse. °SEEK IMMEDIATE MEDICAL CARE IF:  °· You have a persistent fever, with a temperature of 101.8°F (38.8°C) or greater. °· You  develop severe vomiting and are unable to keep the medicine down. °· You develop severe back or abdominal pain despite taking your medicines. °· You begin passing a large amount of blood or clots in your urine. °· You feel extremely weak or faint, or you pass out. °MAKE SURE YOU:  °· Understand these instructions. °· Will watch your condition. °· Will get help right away if you are not doing well or get worse. °Document Released: 10/23/2005 Document Revised: 08/13/2013 Document Reviewed: 06/23/2013 °ExitCare® Patient Information ©2014 [employer]. ° °

## 2014-03-16 LAB — CERVICOVAGINAL ANCILLARY ONLY
CHLAMYDIA, DNA PROBE: NEGATIVE
Neisseria Gonorrhea: NEGATIVE
WET PREP (BD AFFIRM): NEGATIVE
WET PREP (BD AFFIRM): NEGATIVE
WET PREP (BD AFFIRM): NEGATIVE

## 2014-03-16 LAB — URINE CULTURE: Colony Count: 7000

## 2014-05-12 ENCOUNTER — Other Ambulatory Visit: Payer: Self-pay | Admitting: Family Medicine

## 2014-05-12 DIAGNOSIS — Z1231 Encounter for screening mammogram for malignant neoplasm of breast: Secondary | ICD-10-CM

## 2014-06-11 ENCOUNTER — Ambulatory Visit
Admission: RE | Admit: 2014-06-11 | Discharge: 2014-06-11 | Disposition: A | Discharge status: Home / Self Care | Payer: BC Managed Care – PPO | Source: Ambulatory Visit | Attending: Family Medicine | Admitting: Family Medicine

## 2014-06-11 DIAGNOSIS — Z1231 Encounter for screening mammogram for malignant neoplasm of breast: Secondary | ICD-10-CM

## 2015-03-04 ENCOUNTER — Ambulatory Visit: Payer: No Typology Code available for payment source | Admitting: Podiatry

## 2015-03-19 ENCOUNTER — Encounter: Payer: Self-pay | Admitting: Podiatry

## 2015-03-19 ENCOUNTER — Ambulatory Visit (INDEPENDENT_AMBULATORY_CARE_PROVIDER_SITE_OTHER): Payer: No Typology Code available for payment source | Admitting: Podiatry

## 2015-03-19 ENCOUNTER — Ambulatory Visit (INDEPENDENT_AMBULATORY_CARE_PROVIDER_SITE_OTHER): Payer: No Typology Code available for payment source

## 2015-03-19 VITALS — Ht 61.0 in | Wt 148.0 lb

## 2015-03-19 DIAGNOSIS — M2042 Other hammer toe(s) (acquired), left foot: Secondary | ICD-10-CM

## 2015-03-19 DIAGNOSIS — M79671 Pain in right foot: Secondary | ICD-10-CM

## 2015-03-19 DIAGNOSIS — M779 Enthesopathy, unspecified: Secondary | ICD-10-CM

## 2015-03-19 DIAGNOSIS — M79672 Pain in left foot: Secondary | ICD-10-CM | POA: Diagnosis not present

## 2015-03-19 DIAGNOSIS — M2010 Hallux valgus (acquired), unspecified foot: Secondary | ICD-10-CM | POA: Diagnosis not present

## 2015-03-19 MED ORDER — TRIAMCINOLONE ACETONIDE 10 MG/ML IJ SUSP
10.0000 mg | Freq: Once | INTRAMUSCULAR | Status: AC
  Administered 2015-03-19: 10 mg

## 2015-03-19 NOTE — Progress Notes (Signed)
   Subjective:    Patient ID: Rhonda Wells, female    DOB: 12/03/1955, 59 y.o.   MRN: [id number]  HPI    Review of Systems  Respiratory:       Pt states she's had a respiratory infection in the last 30 days.  All other systems reviewed and are negative.      Objective:   Physical Exam        Assessment & Plan:

## 2015-03-21 NOTE — Progress Notes (Signed)
Subjective:     Patient ID: Rhonda Wells, female   DOB: 08/16/1956, 59 y.o.   MRN: [id number]  HPI patient presents stating I have had a lot of pain on top of my left foot and also on my right foot with inflammation noted and I also have a large structural bunion deformity left over right that is becoming tender and will probably need to be corrected some day   Review of Systems  All other systems reviewed and are negative.      Objective:   Physical Exam  Constitutional: She is oriented to person, place, and time.  Cardiovascular: Intact distal pulses.   Musculoskeletal: Normal range of motion.  Neurological: She is oriented to person, place, and time.  Skin: Skin is warm.  Nursing note and vitals reviewed.  neurovascular status found to be intact with muscle strength adequate range of motion within normal limits. Patient's noted to have inflammation in the midfoot region left and right with fluid buildup and pain when palpated and is noted to have large deformity left first metatarsal and moderate on the right with hyperostosis and redness around the first metatarsal head with deviation against the second toe. 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 At this time I went ahead and I recommended treatment of a conservative nature and injected the dorsal inflammatory joint space and capsule with 3 mg Kenalog 5 mg Xylocaine and also the extensor tendon complex bilateral. I then discussed long-term orthotics for elevation of the arch try to take stress off her feet and long-term structural bunion correction will probably be necessary. Scanned for orthotics and  reappoint when they are returned

## 2015-04-09 ENCOUNTER — Encounter: Payer: Self-pay | Admitting: Podiatry

## 2015-04-09 ENCOUNTER — Ambulatory Visit (INDEPENDENT_AMBULATORY_CARE_PROVIDER_SITE_OTHER): Payer: No Typology Code available for payment source | Admitting: Podiatry

## 2015-04-09 VITALS — BP 130/77 | HR 68 | Resp 18

## 2015-04-09 DIAGNOSIS — M79672 Pain in left foot: Secondary | ICD-10-CM | POA: Diagnosis not present

## 2015-04-09 DIAGNOSIS — M79671 Pain in right foot: Secondary | ICD-10-CM

## 2015-04-09 DIAGNOSIS — M779 Enthesopathy, unspecified: Secondary | ICD-10-CM

## 2015-04-09 NOTE — Patient Instructions (Signed)

## 2015-04-09 NOTE — Progress Notes (Signed)
Subjective:     Patient ID: Rhonda Wells, female   DOB: 09/22/1956, 59 y.o.   MRN: [id number]  HPI patient states I'm doing pretty well with minimal discomfort and I I'm excited to have my foot inserts   Review of Systems     Objective:   Physical Exam Neurovascular status intact with no pain in the right foot and mild to moderate discomfort in the left plantar fascia mid arch. Patient does have moderate depression of the arch noted upon weightbearing    Assessment:     Structural changes with fascial like inflammation left    Plan:     H&P performed and at this time I have recommended orthotics which were dispensed with instructions on usage. Physical therapy will be continued and patient will be seen back as needed

## 2015-04-22 DIAGNOSIS — Z792 Long term (current) use of antibiotics: Secondary | ICD-10-CM | POA: Diagnosis not present

## 2015-04-22 DIAGNOSIS — Z8639 Personal history of other endocrine, nutritional and metabolic disease: Secondary | ICD-10-CM | POA: Diagnosis not present

## 2015-04-22 DIAGNOSIS — Z8673 Personal history of transient ischemic attack (TIA), and cerebral infarction without residual deficits: Secondary | ICD-10-CM | POA: Diagnosis not present

## 2015-04-22 DIAGNOSIS — Z7982 Long term (current) use of aspirin: Secondary | ICD-10-CM | POA: Diagnosis not present

## 2015-04-22 DIAGNOSIS — M1990 Unspecified osteoarthritis, unspecified site: Secondary | ICD-10-CM | POA: Insufficient documentation

## 2015-04-22 DIAGNOSIS — L293 Anogenital pruritus, unspecified: Secondary | ICD-10-CM | POA: Insufficient documentation

## 2015-04-22 DIAGNOSIS — D649 Anemia, unspecified: Secondary | ICD-10-CM | POA: Diagnosis not present

## 2015-04-22 DIAGNOSIS — Z79899 Other long term (current) drug therapy: Secondary | ICD-10-CM | POA: Insufficient documentation

## 2015-04-22 DIAGNOSIS — I10 Essential (primary) hypertension: Secondary | ICD-10-CM | POA: Insufficient documentation

## 2015-04-23 ENCOUNTER — Encounter (HOSPITAL_COMMUNITY): Payer: Self-pay | Admitting: Emergency Medicine

## 2015-04-23 ENCOUNTER — Emergency Department (HOSPITAL_COMMUNITY)
Admission: EM | Admit: 2015-04-23 | Discharge: 2015-04-23 | Disposition: A | Discharge status: Home / Self Care | Payer: No Typology Code available for payment source | Attending: Emergency Medicine | Admitting: Emergency Medicine

## 2015-04-23 DIAGNOSIS — N898 Other specified noninflammatory disorders of vagina: Secondary | ICD-10-CM

## 2015-04-23 LAB — WET PREP, GENITAL
TRICH WET PREP: NONE SEEN
Yeast Wet Prep HPF POC: NONE SEEN

## 2015-04-23 LAB — URINALYSIS, ROUTINE W REFLEX MICROSCOPIC
Bilirubin Urine: NEGATIVE
Glucose, UA: NEGATIVE mg/dL
Hgb urine dipstick: NEGATIVE
KETONES UR: NEGATIVE mg/dL
Nitrite: NEGATIVE
PROTEIN: NEGATIVE mg/dL
Specific Gravity, Urine: 1.013 (ref 1.005–1.030)
UROBILINOGEN UA: 0.2 mg/dL (ref 0.0–1.0)
pH: 5.5 (ref 5.0–8.0)

## 2015-04-23 LAB — URINE MICROSCOPIC-ADD ON

## 2015-04-23 LAB — RPR: RPR: NONREACTIVE

## 2015-04-23 LAB — HIV ANTIBODY (ROUTINE TESTING W REFLEX): HIV SCREEN 4TH GENERATION: NONREACTIVE

## 2015-04-23 LAB — GC/CHLAMYDIA PROBE AMP (~~LOC~~) NOT AT ARMC
Chlamydia: NEGATIVE
NEISSERIA GONORRHEA: NEGATIVE

## 2015-04-23 MED ORDER — DEXAMETHASONE 4 MG PO TABS
12.0000 mg | ORAL_TABLET | Freq: Once | ORAL | Status: AC
  Administered 2015-04-23: 12 mg via ORAL
  Filled 2015-04-23: qty 3

## 2015-04-23 MED ORDER — BENZOCAINE-RESORCINOL 5-2 % VA CREA: TOPICAL_CREAM | Freq: Every day | VAGINAL | Status: DC

## 2015-04-23 MED ORDER — FLUCONAZOLE 150 MG PO TABS: 150.0000 mg | ORAL_TABLET | Freq: Once | ORAL | Status: DC

## 2015-04-23 NOTE — Discharge Instructions (Signed)
Your swab did not show yeast, but I will give you a prescription for a pill which would kill any yeast which is there. Do not use that type of condom again.  We will contact you if any of your cultures come back positive.  Fluconazole tablets What is this medicine? FLUCONAZOLE (floo KON na zole) is an antifungal medicine. It is used to treat certain kinds of fungal or yeast infections. This medicine may be used for other purposes; ask your health care provider or pharmacist if you have questions. COMMON BRAND NAME(S): Diflucan What should I tell my health care provider before I take this medicine? They need to know if you have any of these conditions: -electrolyte abnormalities -history of irregular heart beat -kidney disease -an unusual or allergic reaction to fluconazole, other azole antifungals, medicines, foods, dyes, or preservatives -pregnant or trying to get pregnant -breast-feeding How should I use this medicine? Take this medicine by mouth. Follow the directions on the prescription label. Do not take your medicine more often than directed. Talk to your pediatrician regarding the use of this medicine in children. Special care may be needed. This medicine has been used in children as young as 12 months of age. Overdosage: If you think you have taken too much of this medicine contact a poison control center or emergency room at once. NOTE: This medicine is only for you. Do not share this medicine with others. What if I miss a dose? If you miss a dose, take it as soon as you can. If it is almost time for your next dose, take only that dose. Do not take double or extra doses. What may interact with this medicine? Do not take this medicine with any of the following medications: -astemizole -certain medicines for irregular heart beat like dofetilide, dronedarone, quinidine -cisapride -erythromycin -lomitapide -other medicines that prolong the QT interval (cause an abnormal heart  rhythm) -pimozide -terfenadine -thioridazine -tolvaptan -ziprasidone This medicine may also interact with the following medications: -antiviral medicines for HIV or AIDS -birth control pills -certain antibiotics like rifabutin, rifampin -certain medicines for blood pressure like amlodipine, isradipine, felodipine, hydrochlorothiazide, losartan, nifedipine -certain medicines for cancer like cyclophosphamide, vinblastine, vincristine -certain medicines for cholesterol like atorvastatin, lovastatin, fluvastatin, simvastatin -certain medicines for depression, anxiety, or psychotic disturbances like amitriptyline, midazolam, nortriptyline, triazolam -certain medicines for diabetes like glipizide, glyburide, tolbutamide -certain medicines for pain like alfentanil, fentanyl, methadone -certain medicines for seizures like carbamazepine, phenytoin -certain medicines that treat or prevent blood clots like warfarin -halofantrine -medicines that lower your chance of fighting infection like cyclosporine, prednisone, tacrolimus -NSAIDS, medicines for pain and inflammation, like celecoxib, diclofenac, flurbiprofen, ibuprofen, meloxicam, naproxen -other medicines for fungal infections -sirolimus -theophylline -tofacitinib This list may not describe all possible interactions. Give your health care provider a list of all the medicines, herbs, non-prescription drugs, or dietary supplements you use. Also tell them if you smoke, drink alcohol, or use illegal drugs. Some items may interact with your medicine. What should I watch for while using this medicine? Visit your doctor or health care professional for regular checkups. If you are taking this medicine for a long time you may need blood work. Tell your doctor if your symptoms do not improve. Some fungal infections need many weeks or months of treatment to cure. Alcohol can increase possible damage to your liver. Avoid alcoholic drinks. If you have a  vaginal infection, do not have sex until you have finished your treatment. You can wear a sanitary napkin. Do  not use tampons. Wear freshly washed cotton, not synthetic, panties. What side effects may I notice from receiving this medicine? Side effects that you should report to your doctor or health care professional as soon as possible: -allergic reactions like skin rash or itching, hives, swelling of the lips, mouth, tongue, or throat -dark urine -feeling dizzy or faint -irregular heartbeat or chest pain -redness, blistering, peeling or loosening of the skin, including inside the mouth -trouble breathing -unusual bruising or bleeding -vomiting -yellowing of the eyes or skin Side effects that usually do not require medical attention (report to your doctor or health care professional if they continue or are bothersome): -changes in how food tastes -diarrhea -headache -stomach upset or nausea This list may not describe all possible side effects. Call your doctor for medical advice about side effects. You may report side effects to FDA at 1-800-FDA-1088. Where should I keep my medicine? Keep out of the reach of children. Store at room temperature below 30 degrees C (86 degrees F). Throw away any medicine after the expiration date. NOTE: This sheet is a summary. It may not cover all possible information. If you have questions about this medicine, talk to your doctor, pharmacist, or health care provider.  2015, Elsevier/Gold Standard. (2013-05-31 16:13:04)

## 2015-04-23 NOTE — ED Notes (Signed)
Pelvic cart setup bedside. 

## 2015-04-23 NOTE — ED Provider Notes (Signed)
CSN: [id number]     Arrival date & time 04/22/15  2358 History  This chart was scribed for Dione Booze, MD by Annye Asa, ED Scribe. This patient was seen in room D33C/D33C and the patient's care was started at 1:44 AM.    Chief Complaint  Patient presents with  . Vaginal Itching    The patient said she had sex recenlty and her jpartner and he used a different kind of condoms than ususal.     The history is provided by the patient. No language interpreter was used.     HPI Comments: Rhonda Wells is a 59 y.o. female who presents to the Emergency Department complaining of vaginal itching. Patient explains she had vaginal intercourse on 6/14 with a new brand of condom; she states her itching worsened acutely after using an OTC monistat cream on 6/16. She denies vaginal discharge, odor; denies any other concerns at this time.   Past Medical History  Diagnosis Date  . Hypertension   . Shortness of breath     hx fluid in lungs  20's  . Anemia   . Hypercholesteremia   . Sickle cell trait   . Stroke ~ 2003    tia   . Arthritis     "right knee; fingers" (04/23/2013)   Past Surgical History  Procedure Laterality Date  . Cesarean section  1976; 1980; 1982  . Anal fissurectomy  ?2011  . 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 Hypertension Mother   . Diabetes Mother   . Alzheimer's disease Father   . Cancer Father     Prostate   History  Substance Use Topics  . Smoking status: Never Smoker   . Smokeless tobacco: Never Used  . Alcohol Use: No   OB History    No data available     Review of Systems  Genitourinary: Negative for vaginal discharge and vaginal pain.       Vaginal itching  All other systems reviewed and are  negative.  Allergies  Review of patient's allergies indicates no known allergies.  Home Medications   Prior to Admission medications   Medication Sig Start Date End Date Taking? Authorizing Provider  aspirin 81 MG chewable tablet Chew 81 mg by mouth daily.    Historical Provider, MD  cephALEXin (KEFLEX) 500 MG capsule Take 1 capsule (500 mg total) by mouth 2 (two) times daily. Patient not taking: Reported on 03/19/2015 03/04/14   Rodolph Bong, MD  Cholecalciferol (VITAMIN D3) 400 UNITS CAPS Take 1 capsule by mouth daily.    Historical Provider, MD  ferrous sulfate 325 (65 FE) MG tablet Take 325 mg by mouth daily with breakfast.    Historical Provider, MD  Garlic 1000 MG CAPS Take 1 capsule by mouth daily.    Historical Provider, MD  magnesium citrate SOLN Take 0.5 Bottles by mouth daily as needed. For constipation    Historical Provider, MD  methocarbamol (ROBAXIN) 500 MG tablet Take 500 mg by mouth 3 (three) times daily. 04/24/13   Cammy Copa, MD  triamterene-hydrochlorothiazide (MAXZIDE) 75-50 MG per tablet Take 0.5 tablets by mouth daily.    Historical Provider, MD   BP 137/88 mmHg  Pulse 68  Temp(Src) 98.7  F (37.1 C) (Oral)  Resp 20  Wt 147 lb (66.679 kg)  SpO2 100% Physical Exam  Constitutional: She is oriented to person, place, and time. She appears well-developed and well-nourished.  HENT:  Head: Normocephalic and atraumatic.  Eyes: EOM are normal. Pupils are equal, round, and reactive to light.  Neck: Normal range of motion. Neck supple. No JVD present.  Cardiovascular: Normal rate, regular rhythm and normal heart sounds.   No murmur heard. Pulmonary/Chest: Effort normal and breath sounds normal. She has no wheezes. She has no rales. She exhibits no tenderness.  Abdominal: Soft. Bowel sounds are normal. She exhibits no distension and no mass. There is no tenderness.  Genitourinary:  Debris in vagina consistent with recent use of Monistat; cervix normal, no adnexal  masses or tenderness  Musculoskeletal: Normal range of motion. She exhibits no edema.  Lymphadenopathy:    She has no cervical adenopathy.  Neurological: She is alert and oriented to person, place, and time. No cranial nerve deficit. She exhibits normal muscle tone. Coordination normal.  Skin: Skin is warm and dry. No rash noted.  Psychiatric: She has a normal mood and affect. Her behavior is normal. Judgment and thought content normal.  Nursing note and vitals reviewed.   ED Course  Procedures   DIAGNOSTIC STUDIES: Oxygen Saturation is 100% on RA, normal by my interpretation.    COORDINATION OF CARE: 1:47 AM Discussed treatment plan with pt at bedside and pt agreed to plan.  Labs Review Results for orders placed or performed during the hospital encounter of 04/23/15  Wet prep, genital  Result Value Ref Range   Yeast Wet Prep HPF POC NONE SEEN NONE SEEN   Trich, Wet Prep NONE SEEN NONE SEEN   Clue Cells Wet Prep HPF POC FEW (A) NONE SEEN   WBC, Wet Prep HPF POC FEW (A) NONE SEEN  Urinalysis, Routine w reflex microscopic-may I&O cath if menses (not at [facility])  Result Value Ref Range   Color, Urine YELLOW YELLOW   APPearance CLOUDY (A) CLEAR   Specific Gravity, Urine 1.013 1.005 - 1.030   pH 5.5 5.0 - 8.0   Glucose, UA NEGATIVE NEGATIVE mg/dL   Hgb urine dipstick NEGATIVE NEGATIVE   Bilirubin Urine NEGATIVE NEGATIVE   Ketones, ur NEGATIVE NEGATIVE mg/dL   Protein, ur NEGATIVE NEGATIVE mg/dL   Urobilinogen, UA 0.2 0.0 - 1.0 mg/dL   Nitrite NEGATIVE NEGATIVE   Leukocytes, UA MODERATE (A) NEGATIVE  Urine microscopic-add on  Result Value Ref Range   Squamous Epithelial / LPF MANY (A) RARE   WBC, UA 7-10 <3 WBC/hpf   Bacteria, UA RARE RARE   Urine-Other AMORPHOUS URATES/PHOSPHATES    MDM   Final diagnoses:  Vaginal itching    Vaginal itching with no definite cause seen on exam. I'm not confident in the findings of the wet prep since the patient had a large amount of  Monistat in her vaginal vault. She will be treated empirically for possible monilial vaginitis and is given a prescription for Diflucan. However, the most likely cause would be allergy to some component of the new condom that was used. She is advised to come back to the prior type of condom that should been using her she's given a single dose of dexamethasone and is given a prescription for Vagisil for comfort.   I personally performed the services described in this documentation, which was scribed in my presence. The recorded information has been reviewed and is accurate.  Dione Booze, MD 04/23/15 [phone]

## 2015-04-23 NOTE — ED Notes (Signed)
The patient said she had sex recenlty and her jpartner and he used a different kind of condoms than ususal.  She denies any discharge, odor or any other symptoms.  She says she tried to use a vaginal cream and it has made it worse.

## 2015-04-28 ENCOUNTER — Telehealth: Payer: Self-pay | Admitting: *Deleted

## 2015-04-28 NOTE — Telephone Encounter (Signed)
Pt called left name and phone number without message.  I spoke with pt and she said she wasn't able to be on the cellphone at work and would call again tomorrow, because her feet hurt and she is going on a trip Friday night.

## 2015-05-21 ENCOUNTER — Encounter: Payer: Self-pay | Admitting: Podiatry

## 2015-05-21 ENCOUNTER — Ambulatory Visit (INDEPENDENT_AMBULATORY_CARE_PROVIDER_SITE_OTHER): Payer: No Typology Code available for payment source | Admitting: Podiatry

## 2015-05-21 VITALS — BP 136/78 | HR 60 | Resp 12

## 2015-05-21 DIAGNOSIS — M2042 Other hammer toe(s) (acquired), left foot: Secondary | ICD-10-CM

## 2015-05-21 DIAGNOSIS — M2010 Hallux valgus (acquired), unspecified foot: Secondary | ICD-10-CM

## 2015-05-21 DIAGNOSIS — M779 Enthesopathy, unspecified: Secondary | ICD-10-CM | POA: Diagnosis not present

## 2015-05-21 MED ORDER — TRIAMCINOLONE ACETONIDE 10 MG/ML IJ SUSP
10.0000 mg | Freq: Once | INTRAMUSCULAR | Status: AC
  Administered 2015-05-21: 10 mg

## 2015-05-21 NOTE — Progress Notes (Signed)
Subjective:     Patient ID: Rhonda Wells, female   DOB: 03/15/1956, 59 y.o.   MRN: [id number]  HPI patient states my left arch is bother me quite a bit and I'm still having a lot of problems with my bunion deformity and hammertoe deformity second toe left foot. Patient states the top of the foot is doing okay but still sore   Review of Systems     Objective:   Physical Exam Neurovascular status unchanged with patient having discomfort in the mid arch area left with fluid buildup noted in the mid arch and has large structural bunion deformity left and hammertoe deformity second left with rigid contracture of the second toe.    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 I did inject the mid arch area left 3 mg Kenalog 5 mg Xylocaine and applied night splint with instructions on usage and then discussed bunion correction reviewing with her the structure and the fact she's tried wider shoes she's tried anti-inflammatory's and soaks without relief. We will do Austin-type osteotomy along with digital fusion second toe left and she is scheduled for consult mid-August with surgery scheduled for the end of August. Patient wants surgery and will have this done and will be seen back at that time

## 2015-05-24 ENCOUNTER — Ambulatory Visit: Payer: No Typology Code available for payment source | Admitting: Podiatry

## 2015-06-03 ENCOUNTER — Emergency Department (INDEPENDENT_AMBULATORY_CARE_PROVIDER_SITE_OTHER)
Admission: EM | Admit: 2015-06-03 | Discharge: 2015-06-03 | Disposition: A | Discharge status: Home / Self Care | Payer: No Typology Code available for payment source | Source: Home / Self Care | Attending: Family Medicine | Admitting: Family Medicine

## 2015-06-03 ENCOUNTER — Encounter (HOSPITAL_COMMUNITY): Payer: Self-pay | Admitting: Emergency Medicine

## 2015-06-03 DIAGNOSIS — R0982 Postnasal drip: Secondary | ICD-10-CM

## 2015-06-03 DIAGNOSIS — J301 Allergic rhinitis due to pollen: Secondary | ICD-10-CM

## 2015-06-03 DIAGNOSIS — J029 Acute pharyngitis, unspecified: Secondary | ICD-10-CM | POA: Diagnosis not present

## 2015-06-03 LAB — POCT RAPID STREP A: Streptococcus, Group A Screen (Direct): NEGATIVE

## 2015-06-03 MED ORDER — IPRATROPIUM BROMIDE 0.06 % NA SOLN: 2.0000 | Freq: Four times a day (QID) | NASAL | Status: DC

## 2015-06-03 NOTE — Discharge Instructions (Signed)
Allergic Rhinitis 4 drainage and allergy symptoms take Allegra 60 mg twice a day or Claritin 10 mg a day. Use Flonase nasal spray daily as directed At nighttime for sleep and drainage may use Benadryl 25 mg Use nasal saline frequently and copiously to clear nasal passages. Do not use decongestions due to your blood pressure Atrovent nasal spray for nasal congestion and runny nose Drink plenty of fluids Allergic rhinitis is when the mucous membranes in the nose respond to allergens. Allergens are particles in the air that cause your body to have an allergic reaction. This causes you to release allergic antibodies. Through a chain of events, these eventually cause you to release histamine into the blood stream. Although meant to protect the body, it is this release of histamine that causes your discomfort, such as frequent sneezing, congestion, and an itchy, runny nose.  CAUSES  Seasonal allergic rhinitis (hay fever) is caused by pollen allergens that may come from grasses, trees, and weeds. Year-round allergic rhinitis (perennial allergic rhinitis) is caused by allergens such as house dust mites, pet dander, and mold spores.  SYMPTOMS   Nasal stuffiness (congestion).  Itchy, runny nose with sneezing and tearing of the eyes. DIAGNOSIS  Your health care provider can help you determine the allergen or allergens that trigger your symptoms. If you and your health care provider are unable to determine the allergen, skin or blood testing may be used. TREATMENT  Allergic rhinitis does not have a cure, but it can be controlled by:  Medicines and allergy shots (immunotherapy).  Avoiding the allergen. Hay fever may often be treated with antihistamines in pill or nasal spray forms. Antihistamines block the effects of histamine. There are over-the-counter medicines that may help with nasal congestion and swelling around the eyes. Check with your health care provider before taking or giving this medicine.    If avoiding the allergen or the medicine prescribed do not work, there are many new medicines your health care provider can prescribe. Stronger medicine may be used if initial measures are ineffective. Desensitizing injections can be used if medicine and avoidance does not work. Desensitization is when a patient is given ongoing shots until the body becomes less sensitive to the allergen. Make sure you follow up with your health care provider if problems continue. HOME CARE INSTRUCTIONS It is not possible to completely avoid allergens, but you can reduce your symptoms by taking steps to limit your exposure to them. It helps to know exactly what you are allergic to so that you can avoid your specific triggers. SEEK MEDICAL CARE IF:   You have a fever.  You develop a cough that does not stop easily (persistent).  You have shortness of breath.  You start wheezing.  Symptoms interfere with normal daily activities. Document Released: 07/18/2001 Document Revised: 10/28/2013 Document Reviewed: 06/30/2013 [facility] Patient Information 2015 [place], [place]. This information is not intended to replace advice given to you by your health care provider. Make sure you discuss any questions you have with your health care provider.

## 2015-06-03 NOTE — ED Provider Notes (Signed)
CSN: [id number]     Arrival date & time 06/03/15  1851 History   First MD Initiated Contact with Patient 06/03/15 2009     No chief complaint on file.  (Consider location/radiation/quality/duration/timing/severity/associated sxs/prior Treatment) HPI Comments: 59 year old female complaining of an irritated throat, cough, stopped up ears and having to clear her throat frequently. This all started one day ago.   Past Medical History  Diagnosis Date  . Hypertension   . Shortness of breath     hx fluid in lungs  20's  . Anemia   . Hypercholesteremia   . Sickle cell trait   . Stroke ~ 2003    tia   . Arthritis     "right knee; fingers" (04/23/2013)   Past Surgical History  Procedure Laterality Date  . Cesarean section  1976; 1980; 1982  . Anal fissurectomy  ?2011  . 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 Hypertension Mother   . Diabetes Mother   . Alzheimer's disease Father   . Cancer Father     Prostate   History  Substance Use Topics  . Smoking status: Never Smoker   . Smokeless tobacco: Never Used  . Alcohol Use: No   OB History    No data available     Review of Systems  Constitutional: Negative for fever, chills, activity change, appetite change and fatigue.  HENT: Positive for congestion, rhinorrhea and sore throat. Negative for facial swelling and postnasal drip.   Eyes: Negative.   Respiratory: Positive for cough. Negative for shortness of breath.   Cardiovascular: Negative.   Gastrointestinal: Negative.   Musculoskeletal: Negative for neck pain and neck stiffness.  Skin: Negative for pallor and rash.  Neurological: Negative.     Allergies  Review of patient's allergies indicates no known  allergies.  Home Medications   Prior to Admission medications   Medication Sig Start Date End Date Taking? Authorizing Provider  triamterene-hydrochlorothiazide (MAXZIDE) 75-50 MG per tablet Take 0.5 tablets by mouth daily.   Yes Historical Provider, MD  aspirin 81 MG chewable tablet Chew 81 mg by mouth daily.    Historical Provider, MD  benzocaine-resorcinol (VAGISIL) 5-2 % vaginal cream Place vaginally at bedtime. 04/23/15   Dione Booze, MD  benzonatate (TESSALON) 100 MG capsule  02/24/15   Historical Provider, MD  cephALEXin (KEFLEX) 500 MG capsule Take 1 capsule (500 mg total) by mouth 2 (two) times daily. 03/04/14   Rodolph Bong, MD  Cholecalciferol (VITAMIN D3) 400 UNITS CAPS Take 1 capsule by mouth daily.    Historical Provider, MD  ferrous sulfate 325 (65 FE) MG tablet Take 325 mg by mouth daily with breakfast.    Historical Provider, MD  fluconazole (DIFLUCAN) 150 MG tablet Take 1 tablet (150 mg total) by mouth once. 04/23/15   Dione Booze, MD  Garlic 1000 MG CAPS Take 1 capsule by mouth daily.    Historical Provider, MD  ipratropium (ATROVENT) 0.06 % nasal spray Place 2 sprays into both nostrils 4 (four) times daily. 06/03/15   Hayden Rasmussen, NP   There were no vitals taken for this visit. Physical Exam  Constitutional: She is oriented to person, place, and time. She appears well-developed and well-nourished. No distress.  HENT:  Mouth/Throat: No oropharyngeal exudate.  Bilateral TMs are normal. No erythema. Oropharynx with copious amount of clear PND, mild injection and cobblestoning.  Eyes: Conjunctivae and EOM are normal.  Neck: Normal range of motion. Neck supple.  Cardiovascular: Normal rate and normal heart sounds.   No murmur heard. Pulmonary/Chest: Effort normal and breath sounds normal. No respiratory distress. She has no wheezes. She has no rales.  Musculoskeletal: She exhibits no edema.  Lymphadenopathy:    She has no cervical adenopathy.  Neurological: She is alert and  oriented to person, place, and time. She exhibits normal muscle tone.  Skin: Skin is warm.  Psychiatric: She has a normal mood and affect.  Nursing note and vitals reviewed.   ED Course  Procedures (including critical care time) Labs Review Labs Reviewed  POCT RAPID STREP A    Imaging Review No results found.   MDM   1. Allergic rhinitis due to pollen   2. PND (post-nasal drip)   3. Allergic pharyngitis    Allergic Rhinitis 4 drainage and allergy symptoms take Allegra 60 mg twice a day or Claritin 10 mg a day. Use Flonase nasal spray daily as directed At nighttime for sleep and drainage may use Benadryl 25 mg Use nasal saline frequently and copiously to clear nasal passages. Do not use decongestions due to your blood pressure Atrovent nasal spray for nasal congestion and runny nose Drink plenty of fluids    Hayden Rasmussen, NP 06/03/15 2024

## 2015-06-07 LAB — CULTURE, GROUP A STREP: STREP A CULTURE: NEGATIVE

## 2015-06-07 NOTE — ED Notes (Signed)
Not present

## 2015-06-18 ENCOUNTER — Ambulatory Visit: Payer: No Typology Code available for payment source | Admitting: Podiatry

## 2015-06-24 ENCOUNTER — Telehealth: Payer: Self-pay | Admitting: *Deleted

## 2015-06-24 NOTE — Telephone Encounter (Signed)
I called to check benefits and see if authorization is needed for outpatient surgery scheduled for 07/06/2015 for [facility] and [employer] procedures.  Thayer Headings stated that authorization is not needed.  Thayer Headings reference number for the call is [id number].  She transferred me to get benefits.  I spoke to [place], reference number is [id number].  Patient's insurance covers at 100% after $6,750 deductible is met.  Insurance is active until 07/07/2015, patient pays on a month to month basis.

## 2015-06-25 ENCOUNTER — Ambulatory Visit (INDEPENDENT_AMBULATORY_CARE_PROVIDER_SITE_OTHER): Payer: No Typology Code available for payment source | Admitting: Podiatry

## 2015-06-25 ENCOUNTER — Encounter: Payer: Self-pay | Admitting: Podiatry

## 2015-06-25 VITALS — BP 128/76 | HR 65 | Resp 12

## 2015-06-25 DIAGNOSIS — M779 Enthesopathy, unspecified: Secondary | ICD-10-CM | POA: Diagnosis not present

## 2015-06-25 DIAGNOSIS — M2042 Other hammer toe(s) (acquired), left foot: Secondary | ICD-10-CM | POA: Diagnosis not present

## 2015-06-25 DIAGNOSIS — M2010 Hallux valgus (acquired), unspecified foot: Secondary | ICD-10-CM

## 2015-06-27 NOTE — Progress Notes (Deleted)
Subjective:     Patient ID: Rhonda Wells, female   DOB: [birth year], 59 y.o.   MRN: [id number]  HPI   Review of Systems     Objective:   Physical Exam     Assessment:     ***    Plan:     ***

## 2015-06-30 ENCOUNTER — Telehealth: Payer: Self-pay | Admitting: *Deleted

## 2015-06-30 ENCOUNTER — Encounter: Payer: Self-pay | Admitting: *Deleted

## 2015-06-30 DIAGNOSIS — M1710 Unilateral primary osteoarthritis, unspecified knee: Secondary | ICD-10-CM | POA: Insufficient documentation

## 2015-06-30 DIAGNOSIS — I10 Essential (primary) hypertension: Secondary | ICD-10-CM | POA: Insufficient documentation

## 2015-06-30 DIAGNOSIS — T7840XA Allergy, unspecified, initial encounter: Secondary | ICD-10-CM | POA: Insufficient documentation

## 2015-06-30 DIAGNOSIS — M179 Osteoarthritis of knee, unspecified: Secondary | ICD-10-CM | POA: Insufficient documentation

## 2015-06-30 NOTE — Telephone Encounter (Signed)
"  I'm calling on behalf of my mother. We're calling in regards to scheduled surgery the 30th of this month.  She wanted to know if it's okay to take her chewable baby aspirin 81 mg the day of the surgery or should she not be taking that prior to her surgery.  If I don't answer, it's okay to leave a message.

## 2015-07-02 NOTE — Telephone Encounter (Signed)
"  I'm supposed to have surgery next Tuesday but I'm calling to cancel it.  Give me a call."  I attempted to call patient to see if she wanted to reschedule.  I left her a message to call me back.  Patient had already contacted surgical center to cancel surgery.

## 2015-07-03 ENCOUNTER — Encounter (HOSPITAL_COMMUNITY): Payer: Self-pay | Admitting: Emergency Medicine

## 2015-07-03 ENCOUNTER — Emergency Department (HOSPITAL_COMMUNITY)
Admission: EM | Admit: 2015-07-03 | Discharge: 2015-07-03 | Disposition: A | Discharge status: Home / Self Care | Payer: No Typology Code available for payment source | Attending: Emergency Medicine | Admitting: Emergency Medicine

## 2015-07-03 DIAGNOSIS — Z8673 Personal history of transient ischemic attack (TIA), and cerebral infarction without residual deficits: Secondary | ICD-10-CM | POA: Diagnosis not present

## 2015-07-03 DIAGNOSIS — Z792 Long term (current) use of antibiotics: Secondary | ICD-10-CM | POA: Diagnosis not present

## 2015-07-03 DIAGNOSIS — L739 Follicular disorder, unspecified: Secondary | ICD-10-CM | POA: Insufficient documentation

## 2015-07-03 DIAGNOSIS — M1711 Unilateral primary osteoarthritis, right knee: Secondary | ICD-10-CM | POA: Diagnosis not present

## 2015-07-03 DIAGNOSIS — D649 Anemia, unspecified: Secondary | ICD-10-CM | POA: Diagnosis not present

## 2015-07-03 DIAGNOSIS — Z79899 Other long term (current) drug therapy: Secondary | ICD-10-CM | POA: Diagnosis not present

## 2015-07-03 DIAGNOSIS — Z8639 Personal history of other endocrine, nutritional and metabolic disease: Secondary | ICD-10-CM | POA: Diagnosis not present

## 2015-07-03 DIAGNOSIS — M19042 Primary osteoarthritis, left hand: Secondary | ICD-10-CM | POA: Diagnosis not present

## 2015-07-03 DIAGNOSIS — I10 Essential (primary) hypertension: Secondary | ICD-10-CM | POA: Insufficient documentation

## 2015-07-03 DIAGNOSIS — Z7982 Long term (current) use of aspirin: Secondary | ICD-10-CM | POA: Diagnosis not present

## 2015-07-03 DIAGNOSIS — L299 Pruritus, unspecified: Secondary | ICD-10-CM | POA: Diagnosis present

## 2015-07-03 DIAGNOSIS — M19041 Primary osteoarthritis, right hand: Secondary | ICD-10-CM | POA: Diagnosis not present

## 2015-07-03 MED ORDER — DIPHENHYDRAMINE HCL 25 MG PO CAPS: 25.0000 mg | ORAL_CAPSULE | Freq: Four times a day (QID) | ORAL | Status: DC | PRN

## 2015-07-03 NOTE — Discharge Instructions (Signed)

## 2015-07-03 NOTE — ED Provider Notes (Signed)
CSN: [id number]     Arrival date & time 07/03/15  4098 History   First MD Initiated Contact with Patient 07/03/15 [phone]     Chief Complaint  Patient presents with  . Rash  . Abscess     (Consider location/radiation/quality/duration/timing/severity/associated sxs/prior Treatment) HPI   Patient is a 59 y.o. female who presents today with vaginal itching after a "hair bump" expressed blood and pus earlier this week that has been improving.  Patient states that 6 days ago she noticed a "hair bump" on her right labia with associated itching.  Patient states that the "hair bump" expressed blood and pus a couple of days ago and now the bump is gone, but is still complaining of right groin itching.  She has tried an unknown itching cream that provides short term relief along with luke warm showers.  Hot showers make it worse.  Patient denies fever, chills, HA, cough, SOB, CP, N/V/D/C, urinary sxs, or vaginal odor or discharge.  She does not shave her genitalia and has one sexual partner and uses protection.  This has happened before, but she has never experienced this prolonged itching.   Past Medical History  Diagnosis Date  . Hypertension   . Shortness of breath     hx fluid in lungs  20's  . Anemia   . Hypercholesteremia   . Sickle cell trait   . Stroke ~ 2003    tia   . Arthritis     "right knee; fingers" (04/23/2013)   Past Surgical History  Procedure Laterality Date  . Cesarean section  1976; 1980; 1982  . Anal fissurectomy  ?2011  . 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 Hypertension Mother   . Diabetes Mother   . Alzheimer's disease Father   . Cancer Father     Prostate   Social History  Substance Use Topics  .  Smoking status: Never Smoker   . Smokeless tobacco: Never Used  . Alcohol Use: No   OB History    No data available     Review of Systems All other systems negative unless otherwise stated in HPI    Allergies  Review of patient's allergies indicates no known allergies.  Home Medications   Prior to Admission medications   Medication Sig Start Date End Date Taking? Authorizing Provider  aspirin 81 MG chewable tablet Chew 81 mg by mouth daily.    Historical Provider, MD  benzocaine-resorcinol (VAGISIL) 5-2 % vaginal cream Place vaginally at bedtime. 04/23/15   Dione Booze, MD  benzonatate (TESSALON) 100 MG capsule  02/24/15   Historical Provider, MD  cephALEXin (KEFLEX) 500 MG capsule Take 1 capsule (500 mg total) by mouth 2 (two) times daily. 03/04/14   Rodolph Bong, MD  Cholecalciferol (VITAMIN D3) 400 UNITS CAPS Take 1 capsule by mouth daily.    Historical Provider, MD  ferrous sulfate 325 (65 FE) MG tablet Take 325 mg by mouth daily with breakfast.    Historical Provider, MD  fluconazole (DIFLUCAN) 150 MG tablet Take 1 tablet (150 mg total) by mouth once. 04/23/15   Dione Booze, MD  Garlic 1000 MG CAPS Take 1 capsule by mouth daily.    Historical Provider, MD  ipratropium (ATROVENT) 0.06 %  nasal spray Place 2 sprays into both nostrils 4 (four) times daily. 06/03/15   Hayden Rasmussen, NP  triamterene-hydrochlorothiazide (MAXZIDE) 75-50 MG per tablet Take 0.5 tablets by mouth daily.    Historical Provider, MD   BP 145/98 mmHg  Pulse 89  Temp(Src) 98.9 F (37.2 C) (Oral)  Resp 17  Ht 5\' 1"  (1.549 m)  Wt 145 lb (65.772 kg)  BMI 27.41 kg/m2  SpO2 97% Physical Exam  Constitutional: She appears well-developed and well-nourished.  Neck: Normal range of motion. Neck supple.  Cardiovascular: Normal rate, regular rhythm and normal heart sounds.   Pulmonary/Chest: Effort normal and breath sounds normal.  Abdominal: Soft. Bowel sounds are normal. There is no tenderness.  Genitourinary: There  is no rash, tenderness, lesion or injury on the right labia. No erythema or tenderness in the vagina. No vaginal discharge found.  No fluctuance, induration, warmth, erythema, swelling, or drainage on right groin.  Lymphadenopathy:       Right: No inguinal adenopathy present.    ED Course  Procedures (including critical care time) Labs Review Labs Reviewed - No data to display  Imaging Review No results found. I have personally reviewed and evaluated these images and lab results as part of my medical decision-making.   EKG Interpretation None      MDM   Final diagnoses:  Folliculitis    Patient denies fever, chills, or N/V.  VSS. On exam, no induration, fluctuance, erythema, drainage, or swelling on right groin.  No warmth or erythema or pus.  Patient given Benadryl for itching and advised to follow up with PCP.    Cheri Fowler, PA-C 07/03/15 1610  Alvira Monday, MD 07/05/15 [phone]

## 2015-07-03 NOTE — ED Notes (Signed)
Patient here with complaint of "infected hair bump" and rash in groin. States symptoms present for about 2 days. Reports that abscess appeared first then rash. Denies urinary symptoms and fever.

## 2015-07-05 NOTE — Telephone Encounter (Signed)
Please find out why she is cancelling

## 2015-07-07 NOTE — Progress Notes (Signed)
Subjective:     Patient ID: Rhonda Wells, female   DOB: 03/24/1956, 59 y.o.   MRN: [id number]  HPI patient states that my arches been doing pretty well but this bunion is really bothering me and I know on getting need to have it fixed as I tried wider shoes I tried padding and I've tried soaks along with oral anti-inflammatory is without relief of symptoms along with the second toe which continues to bother me because it's lifted in the air   Review of Systems     Objective:   Physical Exam Neurovascular status intact muscle strength was adequate with hyperostosis medial aspect first metatarsal head left with redness and pain when palpated and also noted to have on the second toe rigid contracture with lifting of the toe and pain on the dorsal surface. Patient's arch is improved    Assessment:     HAV deformity left along with hammertoe deformity second digit left with rigid elevation of the toe    Plan:     H&P and x-rays reviewed with patient. At this point I think correction is necessary due to long-standing nature and structural imbalance. Patient wants surgery and I explained the procedure to patient reviewing all possible complications as outlined and the fact that there is no long-term guarantees as far success of surgery. Patient wants procedures and region over and signs consent form after extensive review. Patient understands alternative treatments and complications and the told recovery period is 6 months to one year. Patient had air fracture walker dispensed with all instructions on usage and is scheduled for surgery

## 2015-07-07 NOTE — Telephone Encounter (Signed)
I'm calling regarding your surgery that was scheduled for 07/06/2015.  Did you want to reschedule the surgery?  "No, not right now.  I can't afford the $3000 I'd have to pay.  I'm going to see if I can get Medicaid."  Well if you need any conservative care, call the office and schedule an appointment with Dr. Charlsie Merles.  "Okay I will.  Thank you for calling."

## 2015-07-13 ENCOUNTER — Other Ambulatory Visit: Payer: Self-pay

## 2015-07-14 ENCOUNTER — Encounter: Payer: Self-pay | Admitting: Podiatry

## 2015-08-02 ENCOUNTER — Other Ambulatory Visit: Payer: Self-pay

## 2015-08-02 DIAGNOSIS — Z1231 Encounter for screening mammogram for malignant neoplasm of breast: Secondary | ICD-10-CM

## 2015-08-27 ENCOUNTER — Ambulatory Visit: Payer: No Typology Code available for payment source

## 2015-09-24 ENCOUNTER — Ambulatory Visit: Payer: No Typology Code available for payment source

## 2015-10-21 ENCOUNTER — Ambulatory Visit: Payer: No Typology Code available for payment source

## 2015-10-26 ENCOUNTER — Ambulatory Visit: Payer: No Typology Code available for payment source

## 2015-10-30 ENCOUNTER — Encounter (HOSPITAL_COMMUNITY): Payer: Self-pay | Admitting: Emergency Medicine

## 2015-10-30 DIAGNOSIS — Z8673 Personal history of transient ischemic attack (TIA), and cerebral infarction without residual deficits: Secondary | ICD-10-CM | POA: Insufficient documentation

## 2015-10-30 DIAGNOSIS — I10 Essential (primary) hypertension: Secondary | ICD-10-CM | POA: Diagnosis not present

## 2015-10-30 DIAGNOSIS — Z79899 Other long term (current) drug therapy: Secondary | ICD-10-CM | POA: Diagnosis not present

## 2015-10-30 DIAGNOSIS — Z792 Long term (current) use of antibiotics: Secondary | ICD-10-CM | POA: Diagnosis not present

## 2015-10-30 DIAGNOSIS — N390 Urinary tract infection, site not specified: Secondary | ICD-10-CM | POA: Insufficient documentation

## 2015-10-30 DIAGNOSIS — R739 Hyperglycemia, unspecified: Secondary | ICD-10-CM | POA: Diagnosis not present

## 2015-10-30 DIAGNOSIS — R319 Hematuria, unspecified: Secondary | ICD-10-CM | POA: Diagnosis present

## 2015-10-30 DIAGNOSIS — Z7982 Long term (current) use of aspirin: Secondary | ICD-10-CM | POA: Diagnosis not present

## 2015-10-30 DIAGNOSIS — E876 Hypokalemia: Secondary | ICD-10-CM | POA: Diagnosis not present

## 2015-10-30 DIAGNOSIS — M158 Other polyosteoarthritis: Secondary | ICD-10-CM | POA: Diagnosis not present

## 2015-10-30 DIAGNOSIS — D649 Anemia, unspecified: Secondary | ICD-10-CM | POA: Insufficient documentation

## 2015-10-30 LAB — URINALYSIS, ROUTINE W REFLEX MICROSCOPIC
Bilirubin Urine: NEGATIVE
Glucose, UA: NEGATIVE mg/dL
Ketones, ur: NEGATIVE mg/dL
Nitrite: NEGATIVE
PROTEIN: NEGATIVE mg/dL
Specific Gravity, Urine: 1.006 (ref 1.005–1.030)
pH: 6.5 (ref 5.0–8.0)

## 2015-10-30 LAB — CBC WITH DIFFERENTIAL/PLATELET
BASOS PCT: 0 %
Basophils Absolute: 0 10*3/uL (ref 0.0–0.1)
EOS ABS: 0 10*3/uL (ref 0.0–0.7)
EOS PCT: 0 %
HCT: 38.7 % (ref 36.0–46.0)
Hemoglobin: 12.9 g/dL (ref 12.0–15.0)
LYMPHS ABS: 0.9 10*3/uL (ref 0.7–4.0)
Lymphocytes Relative: 9 %
MCH: 30.2 pg (ref 26.0–34.0)
MCHC: 33.3 g/dL (ref 30.0–36.0)
MCV: 90.6 fL (ref 78.0–100.0)
Monocytes Absolute: 1 10*3/uL (ref 0.1–1.0)
Monocytes Relative: 11 %
Neutro Abs: 7.5 10*3/uL (ref 1.7–7.7)
Neutrophils Relative %: 80 %
PLATELETS: 188 10*3/uL (ref 150–400)
RBC: 4.27 MIL/uL (ref 3.87–5.11)
RDW: 13.2 % (ref 11.5–15.5)
WBC: 9.4 10*3/uL (ref 4.0–10.5)

## 2015-10-30 LAB — BASIC METABOLIC PANEL
Anion gap: 12 (ref 5–15)
BUN: 9 mg/dL (ref 6–20)
CO2: 25 mmol/L (ref 22–32)
CREATININE: 0.95 mg/dL (ref 0.44–1.00)
Calcium: 9.1 mg/dL (ref 8.9–10.3)
Chloride: 100 mmol/L — ABNORMAL LOW (ref 101–111)
Glucose, Bld: 170 mg/dL — ABNORMAL HIGH (ref 65–99)
Potassium: 3.1 mmol/L — ABNORMAL LOW (ref 3.5–5.1)
SODIUM: 137 mmol/L (ref 135–145)

## 2015-10-30 LAB — URINE MICROSCOPIC-ADD ON
Bacteria, UA: NONE SEEN
RBC / HPF: NONE SEEN RBC/hpf (ref 0–5)

## 2015-10-30 NOTE — ED Notes (Signed)
Pt. reports hematuria and "tingling" when urinating onset last week , denies dysuria or fever .

## 2015-10-31 ENCOUNTER — Emergency Department (HOSPITAL_COMMUNITY)
Admission: EM | Admit: 2015-10-31 | Discharge: 2015-10-31 | Disposition: A | Discharge status: Home / Self Care | Payer: No Typology Code available for payment source | Attending: Emergency Medicine | Admitting: Emergency Medicine

## 2015-10-31 DIAGNOSIS — N390 Urinary tract infection, site not specified: Secondary | ICD-10-CM | POA: Diagnosis not present

## 2015-10-31 DIAGNOSIS — R319 Hematuria, unspecified: Secondary | ICD-10-CM

## 2015-10-31 DIAGNOSIS — R739 Hyperglycemia, unspecified: Secondary | ICD-10-CM

## 2015-10-31 DIAGNOSIS — E876 Hypokalemia: Secondary | ICD-10-CM

## 2015-10-31 MED ORDER — POTASSIUM CHLORIDE CRYS ER 20 MEQ PO TBCR: 20.0000 meq | EXTENDED_RELEASE_TABLET | Freq: Once | ORAL | Status: DC

## 2015-10-31 MED ORDER — POTASSIUM CHLORIDE CRYS ER 20 MEQ PO TBCR
40.0000 meq | EXTENDED_RELEASE_TABLET | Freq: Once | ORAL | Status: AC
  Administered 2015-10-31: 40 meq via ORAL
  Filled 2015-10-31: qty 2

## 2015-10-31 MED ORDER — NITROFURANTOIN MONOHYD MACRO 100 MG PO CAPS: 100.0000 mg | ORAL_CAPSULE | Freq: Two times a day (BID) | ORAL | Status: DC

## 2015-10-31 MED ORDER — NITROFURANTOIN MONOHYD MACRO 100 MG PO CAPS
100.0000 mg | ORAL_CAPSULE | Freq: Once | ORAL | Status: AC
  Administered 2015-10-31: 100 mg via ORAL
  Filled 2015-10-31: qty 1

## 2015-10-31 NOTE — Discharge Instructions (Signed)
Your blood sugar today was high - 170. Please follow-up with your doctor to recheck this. Your doctor may also want to do a test called hemoglobin A1c which measures the average of your blood sugar over the last 3 months. If your blood sugar stays high like this, you will need to be on medicine for diabetes. Your doctor can also recheck your potassium and your urine when you were seen.  Urinary Tract Infection Urinary tract infections (UTIs) can develop anywhere along your urinary tract. Your urinary tract is your body's drainage system for removing wastes and extra water. Your urinary tract includes two kidneys, two ureters, a bladder, and a urethra. Your kidneys are a pair of bean-shaped organs. Each kidney is about the size of your fist. They are located below your ribs, one on each side of your spine. CAUSES Infections are caused by microbes, which are microscopic organisms, including fungi, viruses, and bacteria. These organisms are so small that they can only be seen through a microscope. Bacteria are the microbes that most commonly cause UTIs. SYMPTOMS  Symptoms of UTIs may vary by age and gender of the patient and by the location of the infection. Symptoms in young women typically include a frequent and intense urge to urinate and a painful, burning feeling in the bladder or urethra during urination. Older women and men are more likely to be tired, shaky, and weak and have muscle aches and abdominal pain. A fever may mean the infection is in your kidneys. Other symptoms of a kidney infection include pain in your back or sides below the ribs, nausea, and vomiting. DIAGNOSIS To diagnose a UTI, your caregiver will ask you about your symptoms. Your caregiver will also ask you to provide a urine sample. The urine sample will be tested for bacteria and white blood cells. White blood cells are made by your body to help fight infection. TREATMENT  Typically, UTIs can be treated with medication. Because  most UTIs are caused by a bacterial infection, they usually can be treated with the use of antibiotics. The choice of antibiotic and length of treatment depend on your symptoms and the type of bacteria causing your infection. HOME CARE INSTRUCTIONS  If you were prescribed antibiotics, take them exactly as your caregiver instructs you. Finish the medication even if you feel better after you have only taken some of the medication.  Drink enough water and fluids to keep your urine clear or pale yellow.  Avoid caffeine, tea, and carbonated beverages. They tend to irritate your bladder.  Empty your bladder often. Avoid holding urine for long periods of time.  Empty your bladder before and after sexual intercourse.  After a bowel movement, women should cleanse from front to back. Use each tissue only once. SEEK MEDICAL CARE IF:   You have back pain.  You develop a fever.  Your symptoms do not begin to resolve within 3 days. SEEK IMMEDIATE MEDICAL CARE IF:   You have severe back pain or lower abdominal pain.  You develop chills.  You have nausea or vomiting.  You have continued burning or discomfort with urination. MAKE SURE YOU:   Understand these instructions.  Will watch your condition.  Will get help right away if you are not doing well or get worse.   This information is not intended to replace advice given to you by your health care provider. Make sure you discuss any questions you have with your health care provider.   Document Released: 08/02/2005 Document  Revised: 07/14/2015 Document Reviewed: 12/01/2011 Elsevier Interactive Patient Education 2016 [employer].  Nitrofurantoin tablets or capsules What is this medicine? NITROFURANTOIN (nye troe fyoor AN toyn) is an antibiotic. It is used to treat urinary tract infections. This medicine may be used for other purposes; ask your health care provider or pharmacist if you have questions. What should I tell my health care  provider before I take this medicine? They need to know if you have any of these conditions: -anemia -diabetes -glucose-6-phosphate dehydrogenase deficiency -kidney disease -liver disease -lung disease -other chronic illness -an unusual or allergic reaction to nitrofurantoin, other antibiotics, other medicines, foods, dyes or preservatives -pregnant or trying to get pregnant -breast-feeding How should I use this medicine? Take this medicine by mouth with a glass of water. Follow the directions on the prescription label. Take this medicine with food or milk. Take your doses at regular intervals. Do not take your medicine more often than directed. Do not stop taking except on your doctor's advice. Talk to your pediatrician regarding the use of this medicine in children. While this drug may be prescribed for selected conditions, precautions do apply. Overdosage: If you think you have taken too much of this medicine contact a poison control center or emergency room at once. NOTE: This medicine is only for you. Do not share this medicine with others. What if I miss a dose? If you miss a dose, take it as soon as you can. If it is almost time for your next dose, take only that dose. Do not take double or extra doses. What may interact with this medicine? -antacids containing magnesium trisilicate -probenecid -quinolone antibiotics like ciprofloxacin, lomefloxacin, norfloxacin and ofloxacin -sulfinpyrazone This list may not describe all possible interactions. Give your health care provider a list of all the medicines, herbs, non-prescription drugs, or dietary supplements you use. Also tell them if you smoke, drink alcohol, or use illegal drugs. Some items may interact with your medicine. What should I watch for while using this medicine? Tell your doctor or health care professional if your symptoms do not improve or if you get new symptoms. Drink several glasses of water a day. If you are taking  this medicine for a long time, visit your doctor for regular checks on your progress. If you are diabetic, you may get a false positive result for sugar in your urine with certain brands of urine tests. Check with your doctor. What side effects may I notice from receiving this medicine? Side effects that you should report to your doctor or health care professional as soon as possible: -allergic reactions like skin rash or hives, swelling of the face, lips, or tongue -chest pain -cough -difficulty breathing -dizziness, drowsiness -fever or infection -joint aches or pains -pale or blue-tinted skin -redness, blistering, peeling or loosening of the skin, including inside the mouth -tingling, burning, pain, or numbness in hands or feet -unusual bleeding or bruising -unusually weak or tired -yellowing of eyes or skin Side effects that usually do not require medical attention (report to your doctor or health care professional if they continue or are bothersome): -dark urine -diarrhea -headache -loss of appetite -nausea or vomiting -temporary hair loss This list may not describe all possible side effects. Call your doctor for medical advice about side effects. You may report side effects to FDA at 1-800-FDA-1088. Where should I keep my medicine? Keep out of the reach of children. Store at room temperature between 15 and 30 degrees C (59 and 86 degrees  F). Protect from light. Throw away any unused medicine after the expiration date. NOTE: This sheet is a summary. It may not cover all possible information. If you have questions about this medicine, talk to your doctor, pharmacist, or health care provider.    2016, Elsevier/Gold Standard. (2008-05-13 15:56:47)  Potassium Salts tablets, extended-release tablets or capsules What is this medicine? POTASSIUM (poe TASS i um) is a natural salt that is important for the heart, muscles, and nerves. It is found in many foods and is normally supplied  by a well balanced diet. This medicine is used to treat low potassium. This medicine may be used for other purposes; ask your health care provider or pharmacist if you have questions. What should I tell my health care provider before I take this medicine? They need to know if you have any of these conditions: -Addison's disease -dehydration -diabetes -difficulty swallowing -heart disease -history of high levels of potassium in the blood -irregular heartbeat -kidney disease -recent severe burn -stomach ulcers or other stomach problems -an unusual or allergic reaction to potassium, tartrazine, other medicines, foods, dyes, or preservatives -pregnant or trying to get pregnant -breast-feeding How should I use this medicine? Take this medicine by mouth with a full glass of water. Take with food. Follow the directions on the prescription label. Do not suck on, crush, or chew this medicine. If you have difficulty swallowing, ask the pharmacist how to take. Take your medicine at regular intervals. Do not take it more often than directed. Do not stop taking except on your doctor's advice. Talk to your pediatrician regarding the use of this medicine in children. Special care may be needed. Overdosage: If you think you have taken too much of this medicine contact a poison control center or emergency room at once. NOTE: This medicine is only for you. Do not share this medicine with others. What if I miss a dose? If you miss a dose, take it as soon as you can. If it is almost time for your next dose, take only that dose. Do not take double or extra doses. What may interact with this medicine? Do not take this medicine with any of the following medications: -eplerenone -certain medicines for stomach problems like atropine; difenoxin and glycopyrrolate -sodium polystyrene sulfonate This medicine may also interact with the following medications: -certain medicines for blood pressure or heart disease  like lisinopril, losartan, quinapril, valsartan -medicines for cold or allergies -NSAIDs, medicines for pain and inflammation, like ibuprofen or napoxen -other potassium supplements -salt substitutes -some diuretics This list may not describe all possible interactions. Give your health care provider a list of all the medicines, herbs, non-prescription drugs, or dietary supplements you use. Also tell them if you smoke, drink alcohol, or use illegal drugs. Some items may interact with your medicine. What should I watch for while using this medicine? Visit your doctor or health care professional for regular check ups. You will need lab work done regularly. You may need to be on a special diet while taking this medicine. Ask your doctor. What side effects may I notice from receiving this medicine? Side effects that you should report to your doctor or health care professional as soon as possible: -allergic reactions like skin rash, itching or hives, swelling of the face, lips, or tongue -anxious -black, tarry stools -breathing problems -confusion -heartburn -irregular heartbeat -numbness or tingling in hands or feet -pain when swallowing -unusually weak or tired -weakness, heaviness of legs Side effects that usually do not  require medical attention (report to your doctor or health care professional if they continue or are bothersome): -diarrhea -nausea -upset stomach -vomiting This list may not describe all possible side effects. Call your doctor for medical advice about side effects. You may report side effects to FDA at 1-800-FDA-1088. Where should I keep my medicine? Keep out of the reach of children. Store at room temperature between 15 and 30 degrees C (59 and 86 degrees F ). Keep bottle closed tightly to protect this medicine from light and moisture. Throw away any unused medicine after the expiration date. NOTE: This sheet is a summary. It may not cover all possible information. If  you have questions about this medicine, talk to your doctor, pharmacist, or health care provider.    2016, Elsevier/Gold Standard. (2015-04-01 08:55:21)

## 2015-10-31 NOTE — ED Notes (Signed)
Patient verbalized understanding of discharge instructions and denies any further needs or questions at this time. VS stable, patient ambulatory with steady gait. 

## 2015-10-31 NOTE — ED Provider Notes (Signed)
CSN: [id number]     Arrival date & time 10/30/15  1900 History   By signing my name below, I, Evon Slack, attest that this documentation has been prepared under the direction and in the presence of Dione Booze, MD. Electronically Signed: Evon Slack, ED Scribe. 10/31/2015. 1:01 AM.    Chief Complaint  Patient presents with  . Hematuria   Patient is a 59 y.o. female presenting with hematuria. The history is provided by the patient. No language interpreter was used.  Hematuria Associated symptoms include abdominal pain.   HPI Comments: Rhonda Wells is a 59 y.o. female who presents to the Emergency Department complaining of hematuria onset 4 days prior. Pt reports fever, chills, abdominal pain, urinary urgency. Pt states that she has tried drinking water and cranberry juice with no relief. Pt sates that she believes she has an UTI. Denies dysuria or other related symptoms.     Past Medical History  Diagnosis Date  . Hypertension   . Shortness of breath     hx fluid in lungs  20's  . Anemia   . Hypercholesteremia   . Sickle cell trait (HCC)   . Stroke [facility]) ~ 2003    tia   . Arthritis     "right knee; fingers" (04/23/2013)   Past Surgical History  Procedure Laterality Date  . Cesarean section  1976; 1980; 1982  . Anal fissurectomy  ?2011  . 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 Hypertension Mother   . Diabetes Mother   . Alzheimer's disease Father   . Cancer Father     Prostate   Social History  Substance Use Topics  . Smoking status: Never Smoker   . Smokeless tobacco: Never Used  . Alcohol Use: No   OB History    No data available     Review of Systems  Constitutional: Positive for fever and  chills.  Gastrointestinal: Positive for abdominal pain.  Genitourinary: Positive for urgency and hematuria. Negative for dysuria.  All other systems reviewed and are negative.     Allergies  Review of patient's allergies indicates no known allergies.  Home Medications   Prior to Admission medications   Medication Sig Start Date End Date Taking? Authorizing Provider  aspirin 81 MG chewable tablet Chew 81 mg by mouth daily.    Historical Provider, MD  benzocaine-resorcinol (VAGISIL) 5-2 % vaginal cream Place vaginally at bedtime. 04/23/15   Dione Booze, MD  benzonatate (TESSALON) 100 MG capsule  02/24/15   Historical Provider, MD  cephALEXin (KEFLEX) 500 MG capsule Take 1 capsule (500 mg total) by mouth 2 (two) times daily. 03/04/14   Rodolph Bong, MD  Cholecalciferol (VITAMIN D3) 400 UNITS CAPS Take 1 capsule by mouth daily.    Historical Provider, MD  diphenhydrAMINE (BENADRYL) 25 mg capsule Take 1 capsule (25 mg total) by mouth every 6 (six) hours as needed. 07/03/15   Cheri Fowler, PA-C  ferrous sulfate 325 (65 FE) MG tablet Take 325 mg by mouth daily with breakfast.    Historical Provider, MD  fluconazole (DIFLUCAN) 150 MG tablet Take 1 tablet (150 mg total) by mouth once. 04/23/15   Dione Booze, MD  Garlic 1000 MG CAPS Take 1 capsule by  mouth daily.    Historical Provider, MD  ipratropium (ATROVENT) 0.06 % nasal spray Place 2 sprays into both nostrils 4 (four) times daily. 06/03/15   Hayden Rasmussenavid Mabe, NP  triamterene-hydrochlorothiazide (MAXZIDE) 75-50 MG per tablet Take 0.5 tablets by mouth daily.    Historical Provider, MD   BP 133/94 mmHg  Pulse 89  Temp(Src) 98.8 F (37.1 C) (Oral)  Resp 16  Ht 5\' 1"  (1.549 m)  Wt 144 lb (65.318 kg)  BMI 27.22 kg/m2  SpO2 100%   Physical Exam  Constitutional: She is oriented to person, place, and time. She appears well-developed and well-nourished. No distress.  HENT:  Head: Normocephalic and atraumatic.  Eyes: Conjunctivae and EOM are normal.  Pupils are equal, round, and reactive to light.  Neck: Normal range of motion. Neck supple. No JVD present.  Cardiovascular: Normal rate and regular rhythm.   No murmur heard. Pulmonary/Chest: Effort normal and breath sounds normal. She has no wheezes. She has no rales. She exhibits no tenderness.  Abdominal: Soft. Bowel sounds are normal. She exhibits no distension and no mass. There is no tenderness.  Musculoskeletal: Normal range of motion. She exhibits no edema.  Lymphadenopathy:    She has no cervical adenopathy.  Neurological: She is alert and oriented to person, place, and time. No cranial nerve deficit. She exhibits normal muscle tone. Coordination normal.  Skin: Skin is warm and dry. No rash noted.  Psychiatric: She has a normal mood and affect. Her behavior is normal. Judgment and thought content normal.  Nursing note and vitals reviewed.   ED Course  Procedures (including critical care time) DIAGNOSTIC STUDIES: Oxygen Saturation is 100% on RA, normal by my interpretation.    COORDINATION OF CARE: 1:01 AM-Discussed treatment plan with pt at bedside and pt agreed to plan.     Labs Review Results for orders placed or performed during the hospital encounter of 10/31/15  Urinalysis, Routine w reflex microscopic ([facility])  Result Value Ref Range   Color, Urine YELLOW YELLOW   APPearance CLOUDY (A) CLEAR   Specific Gravity, Urine 1.006 1.005 - 1.030   pH 6.5 5.0 - 8.0   Glucose, UA NEGATIVE NEGATIVE mg/dL   Hgb urine dipstick LARGE (A) NEGATIVE   Bilirubin Urine NEGATIVE NEGATIVE   Ketones, ur NEGATIVE NEGATIVE mg/dL   Protein, ur NEGATIVE NEGATIVE mg/dL   Nitrite NEGATIVE NEGATIVE   Leukocytes, UA LARGE (A) NEGATIVE  CBC with Differential  Result Value Ref Range   WBC 9.4 4.0 - 10.5 K/uL   RBC 4.27 3.87 - 5.11 MIL/uL   Hemoglobin 12.9 12.0 - 15.0 g/dL   HCT 16.138.7 09.636.0 - 04.546.0 %   MCV 90.6 78.0 - 100.0 fL   MCH 30.2 26.0 - 34.0 pg   MCHC 33.3 30.0 - 36.0 g/dL    RDW 40.913.2 81.111.5 - 91.415.5 %   Platelets 188 150 - 400 K/uL   Neutrophils Relative % 80 %   Neutro Abs 7.5 1.7 - 7.7 K/uL   Lymphocytes Relative 9 %   Lymphs Abs 0.9 0.7 - 4.0 K/uL   Monocytes Relative 11 %   Monocytes Absolute 1.0 0.1 - 1.0 K/uL   Eosinophils Relative 0 %   Eosinophils Absolute 0.0 0.0 - 0.7 K/uL   Basophils Relative 0 %   Basophils Absolute 0.0 0.0 - 0.1 K/uL  Basic metabolic panel  Result Value Ref Range   Sodium 137 135 - 145 mmol/L   Potassium 3.1 (L) 3.5 - 5.1 mmol/L  Chloride 100 (L) 101 - 111 mmol/L   CO2 25 22 - 32 mmol/L   Glucose, Bld 170 (H) 65 - 99 mg/dL   BUN 9 6 - 20 mg/dL   Creatinine, Ser 6.57 0.44 - 1.00 mg/dL   Calcium 9.1 8.9 - 84.6 mg/dL   GFR calc non Af Amer >60 >60 mL/min   GFR calc Af Amer >60 >60 mL/min   Anion gap 12 5 - 15  Urine microscopic-add on  Result Value Ref Range   Squamous Epithelial / LPF 0-5 (A) NONE SEEN   WBC, UA TOO NUMEROUS TO COUNT 0 - 5 WBC/hpf   RBC / HPF NONE SEEN 0 - 5 RBC/hpf   Bacteria, UA NONE SEEN NONE SEEN   I have personally reviewed and evaluated these lab results as part of my medical decision-making.  MDM   Final diagnoses:  Urinary tract infection with hematuria, site unspecified  Hypokalemia  Hyperglycemia      Apparent urinary tract infection. Hematuria is not evident today. Incidental findings were hypokalemia and hyperglycemia. Patient was advised of hyperglycemia as she may be early onset diabetes. She is referred back to PCP to check hemoglobin A1c level and repeat blood sugar. She's given a dose of potassium in the ED and discharged with prescription for potassium. Potassium prescription is given for limited. Since she is on a potassium sparing diuretic which would occiput. Risk for developing hyperkalemia. PCP will need to recheck her potassium.   I personally performed the services described in this documentation, which was scribed in my presence. The recorded information has been reviewed  and is accurate.       Dione Booze, MD 10/31/15 [phone]

## 2015-11-02 LAB — URINE CULTURE

## 2015-11-03 ENCOUNTER — Telehealth (HOSPITAL_COMMUNITY): Payer: Self-pay

## 2015-11-03 NOTE — Telephone Encounter (Signed)
Post ED Visit - Positive Culture Follow-up  Culture report reviewed by antimicrobial stewardship pharmacist:  []  Enzo BiNathan Batchelder, Pharm.D. []  Celedonio MiyamotoJeremy Frens, [address] BCPS [x]  Garvin FilaMike Maccia, Pharm.D. []  Georgina PillionElizabeth Martin, Pharm.D., BCPS []  [place] Pham, [address]Ph[address]., BCPS, AAHIVP []  Estella HuskMichelle Turner, Pharm.D., BCPS, AAHIVP []  Cassie Stewart, Pharm.D. []  Sherle Poeob Vincent, [place].  Positive urine culture, >/= 100,000 colonies -> E Coli Treated with Nitrofurantoin, organism sensitive to the same and no further patient follow-up is required at this time.  Rhonda RightClark, Rhonda Wells 11/03/2015, 9:20 AM

## 2015-11-19 ENCOUNTER — Encounter: Payer: Self-pay | Admitting: Podiatry

## 2015-11-19 ENCOUNTER — Ambulatory Visit (INDEPENDENT_AMBULATORY_CARE_PROVIDER_SITE_OTHER): Payer: Medicaid Other | Admitting: Podiatry

## 2015-11-19 ENCOUNTER — Ambulatory Visit
Admission: RE | Admit: 2015-11-19 | Discharge: 2015-11-19 | Disposition: A | Discharge status: Home / Self Care | Payer: Medicaid Other | Source: Ambulatory Visit

## 2015-11-19 VITALS — BP 119/79 | HR 67 | Resp 12

## 2015-11-19 DIAGNOSIS — M2040 Other hammer toe(s) (acquired), unspecified foot: Secondary | ICD-10-CM | POA: Diagnosis not present

## 2015-11-19 DIAGNOSIS — M2010 Hallux valgus (acquired), unspecified foot: Secondary | ICD-10-CM

## 2015-11-19 DIAGNOSIS — M7750 Other enthesopathy of unspecified foot: Secondary | ICD-10-CM | POA: Diagnosis not present

## 2015-11-19 DIAGNOSIS — Z1231 Encounter for screening mammogram for malignant neoplasm of breast: Secondary | ICD-10-CM

## 2015-11-19 DIAGNOSIS — M779 Enthesopathy, unspecified: Secondary | ICD-10-CM

## 2015-11-19 DIAGNOSIS — M2042 Other hammer toe(s) (acquired), left foot: Secondary | ICD-10-CM

## 2015-11-19 MED ORDER — TRIAMCINOLONE ACETONIDE 10 MG/ML IJ SUSP
10.0000 mg | Freq: Once | INTRAMUSCULAR | Status: AC
  Administered 2015-11-19: 10 mg

## 2015-11-21 NOTE — Progress Notes (Signed)
Subjective:     Patient ID: Rhonda Wells, female   DOB: 08/29/1956, 60 y.o.   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 She will have it done as needed

## 2016-06-14 ENCOUNTER — Encounter (HOSPITAL_COMMUNITY): Payer: Self-pay | Admitting: Emergency Medicine

## 2016-06-14 ENCOUNTER — Ambulatory Visit (HOSPITAL_COMMUNITY)
Admission: EM | Admit: 2016-06-14 | Discharge: 2016-06-14 | Disposition: A | Discharge status: Home / Self Care | Payer: Medicaid Other | Attending: Family Medicine | Admitting: Family Medicine

## 2016-06-14 ENCOUNTER — Ambulatory Visit (INDEPENDENT_AMBULATORY_CARE_PROVIDER_SITE_OTHER): Payer: Medicaid Other

## 2016-06-14 DIAGNOSIS — S46002A Unspecified injury of muscle(s) and tendon(s) of the rotator cuff of left shoulder, initial encounter: Secondary | ICD-10-CM | POA: Diagnosis not present

## 2016-06-14 MED ORDER — IBUPROFEN 800 MG PO TABS: 800.0000 mg | ORAL_TABLET | Freq: Three times a day (TID) | ORAL | 0 refills | Status: DC

## 2016-06-14 MED ORDER — IBUPROFEN 800 MG PO TABS
800.0000 mg | ORAL_TABLET | Freq: Three times a day (TID) | ORAL | 0 refills | Status: DC
Start: 2016-06-14 — End: 2018-06-14

## 2016-06-14 NOTE — ED Provider Notes (Signed)
CSN: [id number]     Arrival date & time 06/14/16  1800 History   First MD Initiated Contact with Patient 06/14/16 1848     Chief Complaint  Patient presents with  . Shoulder Pain   (Consider location/radiation/quality/duration/timing/severity/associated sxs/prior Treatment) Rhonda Wells is a 60 y.o female presents today for left shoulder pain. She is a Administrator, sportsdaycare worker working with toddlers. She started to experience left shoulder pain 1 week ago. She reports that she could not lift her left arm up, having a hard time picking a toddler up at work. She states that her left arm feels weak at times. She states that she couldn't hold a pie too long otherwise her shoulder would start hurting. She denies any radiation of her pain. She denies numbness or tingling sensation. She have been using ice and heat therapy, OTC pain ointment/gel, and aleve with minimal relieve. She does have  Underlying osteoarthritis but denies having OA in her left shoulder. She also denies injury =.       Past Medical History:  Diagnosis Date  . Anemia   . Arthritis    "right knee; fingers" (04/23/2013)  . Hypercholesteremia   . Hypertension   . Shortness of breath    hx fluid in lungs  20's  . Sickle cell trait (HCC)   . Stroke [facility](HCC) ~ 2003   tia    Past Surgical History:  Procedure Laterality Date  . ANAL FISSURECTOMY  ?2011  . CESAREAN SECTION  1976; 1980; 1982  . TOTAL KNEE ARTHROPLASTY Right 04/22/2013  . TOTAL KNEE ARTHROPLASTY Right 04/22/2013   Procedure: TOTAL KNEE ARTHROPLASTY;  Surgeon: Cammy CopaGregory Scott Dean, MD;  [facility];  Service: Orthopedics;  Laterality: Right;  Right Total Knee Arthroplasty   Family History  Problem Relation Age of Onset  . Hypertension Mother   . Diabetes Mother   . Alzheimer's disease Father   . Cancer Father     Prostate   Social History  Substance Use Topics  . Smoking status: Never Smoker  . Smokeless tobacco: Never Used  . Alcohol use No   OB History    No data  available     Review of Systems  Musculoskeletal:       Pos for left shoulder pain, pain non-radiating, no numbness or tingling sensation in her arm  All other systems reviewed and are negative.   Allergies  Review of patient's allergies indicates no known allergies.  Home Medications   Prior to Admission medications   Medication Sig Start Date End Date Taking? Authorizing Provider  aspirin 81 MG chewable tablet Chew 81 mg by mouth daily.   Yes Historical Provider, MD  Cholecalciferol (VITAMIN D3) 400 UNITS CAPS Take 1 capsule by mouth daily.   Yes Historical Provider, MD  ferrous sulfate 325 (65 FE) MG tablet Take 325 mg by mouth daily with breakfast.   Yes Historical Provider, MD  Garlic 1000 MG CAPS Take 1 capsule by mouth daily.   Yes Historical Provider, MD  potassium chloride SA (K-DUR,KLOR-CON) 20 MEQ tablet Take 1 tablet (20 mEq total) by mouth once. 10/31/15  Yes Dione Boozeavid Glick, MD  triamterene-hydrochlorothiazide (MAXZIDE) 75-50 MG per tablet Take 0.5 tablets by mouth daily.   Yes Historical Provider, MD  benzocaine-resorcinol (VAGISIL) 5-2 % vaginal cream Place vaginally at bedtime. 04/23/15   Dione Boozeavid Glick, MD  benzonatate (TESSALON) 100 MG capsule  02/24/15   Historical Provider, MD  cephALEXin (KEFLEX) 500 MG capsule Take 1 capsule (500 mg total)  by mouth 2 (two) times daily. 03/04/14   Rodolph Bong, MD  diphenhydrAMINE (BENADRYL) 25 mg capsule Take 1 capsule (25 mg total) by mouth every 6 (six) hours as needed. 07/03/15   Cheri Fowler, PA-C  fluconazole (DIFLUCAN) 150 MG tablet Take 1 tablet (150 mg total) by mouth once. 04/23/15   Dione Booze, MD  ibuprofen (ADVIL,MOTRIN) 800 MG tablet Take 1 tablet (800 mg total) by mouth 3 (three) times daily. 06/14/16   Lucia Estelle, NP  ipratropium (ATROVENT) 0.06 % nasal spray Place 2 sprays into both nostrils 4 (four) times daily. 06/03/15   Hayden Rasmussen, NP  nitrofurantoin, macrocrystal-monohydrate, (MACROBID) 100 MG capsule Take 1 capsule (100 mg  total) by mouth 2 (two) times daily. X 7 days 10/31/15   Dione Booze, MD   Meds Ordered and Administered this Visit  Medications - No data to display  BP 126/87 (BP Location: Right Arm)   Pulse 82   Temp 98.7 F (37.1 C) (Oral)   Resp 18   SpO2 97%  No data found.   Physical Exam  Constitutional: She is oriented to person, place, and time. She appears well-developed and well-nourished.  Cardiovascular: Normal rate and regular rhythm.   Pulmonary/Chest: Effort normal and breath sounds normal.  Musculoskeletal:       Left shoulder: She exhibits no tenderness, no swelling, no effusion, no crepitus, no deformity, normal pulse and normal strength.  She does have pain and difficulty with active ROM on flexion, extension, and abduction.   Neurological: She is alert and oriented to person, place, and time.  Motor and sensation intact on her left arm     Urgent Care Course   Clinical Course    Procedures (including critical care time)  Labs Review Labs Reviewed - No data to display  Imaging Review Dg Shoulder Left  Result Date: 06/14/2016 CLINICAL DATA:  Left shoulder pain around the proximal humerus. Symptoms for 2 weeks. Limited and painful range of motion. No known injury. EXAM: LEFT SHOULDER - 2+ VIEW COMPARISON:  None. FINDINGS: There is no evidence of fracture or dislocation. There is no evidence of arthropathy or other focal bone abnormality. Soft tissues are unremarkable. IMPRESSION: Negative. Electronically Signed   By: Norva Pavlov M.D.   On: 06/14/2016 19:29     MDM   1. Rotator cuff injury, left, initial encounter    Rhonda Wells is a 60 y.o female presents today for left shoulder pain for 1 week with difficulty lifting her left arm up, a hard time picking up a toddler, and would have some weakness at times. She denies injury as well. On exam, she exhibits pain and difficulty with ROM especially with flexion, extension and abduction. Shoulder xray was negative for  fracture, dislocation, and no evidence of arthropathy or other focal bone abnormality. Rotator cuff injury is suspected; will treat conservatively for now with rest,  ibuprofen 800 mg TID for 5 days.Follow up with an orthopedic doctor if shoulder pain continues or does not improve. All questions were answered prior to discharging home.    Lucia Estelle, NP 06/15/16 2148

## 2016-06-14 NOTE — Discharge Instructions (Addendum)
Your shoulder xray was negative. I suspect that you have a Rotator cuff injury. Avoid overhead work and avoid heavy lifting, take ibuprofen TID for 5 days. Follow up with an orthopedic doctor if shoulder pain continues or does not improve as a MRI would be indicated then

## 2016-06-14 NOTE — ED Triage Notes (Signed)
[facility] a [facility] of left shoulder pain that radiates down her left arm and into her left hand. The patient stated that it has been getting gradually worse over the last 2 weeks. The patient denied any known injury.

## 2016-12-12 ENCOUNTER — Other Ambulatory Visit: Payer: Self-pay | Admitting: Family Medicine

## 2016-12-12 DIAGNOSIS — Z1231 Encounter for screening mammogram for malignant neoplasm of breast: Secondary | ICD-10-CM

## 2016-12-30 ENCOUNTER — Encounter (HOSPITAL_COMMUNITY): Payer: Self-pay | Admitting: *Deleted

## 2016-12-30 ENCOUNTER — Emergency Department (HOSPITAL_COMMUNITY)
Admission: EM | Admit: 2016-12-30 | Discharge: 2016-12-30 | Disposition: A | Discharge status: Home / Self Care | Payer: BLUE CROSS/BLUE SHIELD | Attending: Emergency Medicine | Admitting: Emergency Medicine

## 2016-12-30 ENCOUNTER — Emergency Department (HOSPITAL_COMMUNITY): Payer: BLUE CROSS/BLUE SHIELD

## 2016-12-30 DIAGNOSIS — Z8673 Personal history of transient ischemic attack (TIA), and cerebral infarction without residual deficits: Secondary | ICD-10-CM | POA: Diagnosis not present

## 2016-12-30 DIAGNOSIS — Z96651 Presence of right artificial knee joint: Secondary | ICD-10-CM | POA: Insufficient documentation

## 2016-12-30 DIAGNOSIS — I10 Essential (primary) hypertension: Secondary | ICD-10-CM | POA: Insufficient documentation

## 2016-12-30 DIAGNOSIS — R0789 Other chest pain: Secondary | ICD-10-CM | POA: Diagnosis present

## 2016-12-30 DIAGNOSIS — K2930 Chronic superficial gastritis without bleeding: Secondary | ICD-10-CM | POA: Diagnosis not present

## 2016-12-30 DIAGNOSIS — Z7982 Long term (current) use of aspirin: Secondary | ICD-10-CM | POA: Insufficient documentation

## 2016-12-30 LAB — I-STAT TROPONIN, ED: Troponin i, poc: 0 ng/mL (ref 0.00–0.08)

## 2016-12-30 LAB — BASIC METABOLIC PANEL
ANION GAP: 10 (ref 5–15)
BUN: 13 mg/dL (ref 6–20)
CHLORIDE: 103 mmol/L (ref 101–111)
CO2: 27 mmol/L (ref 22–32)
Calcium: 9 mg/dL (ref 8.9–10.3)
Creatinine, Ser: 0.79 mg/dL (ref 0.44–1.00)
GFR calc non Af Amer: >60 mL/min (ref >60)
Glucose, Bld: 116 mg/dL — ABNORMAL HIGH (ref 65–99)
POTASSIUM: 3.8 mmol/L (ref 3.5–5.1)
SODIUM: 140 mmol/L (ref 135–145)

## 2016-12-30 LAB — CBC
HEMATOCRIT: 38.4 % (ref 36.0–46.0)
HEMOGLOBIN: 12.7 g/dL (ref 12.0–15.0)
MCH: 30.1 pg (ref 26.0–34.0)
MCHC: 33.1 g/dL (ref 30.0–36.0)
MCV: 91 fL (ref 78.0–100.0)
PLATELETS: 225 10*3/uL (ref 150–400)
RBC: 4.22 MIL/uL (ref 3.87–5.11)
RDW: 13.3 % (ref 11.5–15.5)
WBC: 5.5 10*3/uL (ref 4.0–10.5)

## 2016-12-30 MED ORDER — SUCRALFATE 1 GM/10ML PO SUSP: 1.0000 g | Freq: Three times a day (TID) | ORAL | 0 refills | Status: DC

## 2016-12-30 MED ORDER — GI COCKTAIL ~~LOC~~
30.0000 mL | Freq: Once | ORAL | Status: AC
  Administered 2016-12-30: 30 mL via ORAL
  Filled 2016-12-30: qty 30

## 2016-12-30 NOTE — Discharge Instructions (Signed)
You have been given a prescription for medication called Carafate.  Please use this for the next 3-5 days then as needed thereafter.  Please make a point with your primary care physician for follow-up

## 2016-12-30 NOTE — ED Provider Notes (Signed)
[facility] Provider Note   CSN: [id number] Arrival date & time: 12/30/16  2029     History   Chief Complaint Chief Complaint  Patient presents with  . Chest Pain  . Leg Pain    HPI Rhonda Wells is a 61 y.o. female.  This is a 61 year old female who states around 11:00 this morning.  She's developed some discomfort in her upper chest, worse when she swallowed feeling like there was a lump there.  She has done nothing to alleviate her symptoms throughout the day, but did manage to proceed with her normal activities including eating in a restaurant and going to movies.  She states that she also noticed some discomfort in the back of her left arm and the back of her right leg that lasted for several seconds to a minute and has not returned.  It resolved spontaneously.  Denies any recent illnesses, trauma, shortness of breath, coughing      Past Medical History:  Diagnosis Date  . Anemia   . Arthritis    "right knee; fingers" (04/23/2013)  . Hypercholesteremia   . Hypertension   . Shortness of breath    hx fluid in lungs  20's  . Sickle cell trait (HCC)   . Stroke [facility]) ~ 2003   tia     Patient Active Problem List   Diagnosis Date Noted  . Allergic state 06/30/2015  . BP (high blood pressure) 06/30/2015  . Arthritis of knee, degenerative 06/30/2015  . HLD (hyperlipidemia) 04/17/2013    Past Surgical History:  Procedure Laterality Date  . ANAL FISSURECTOMY  ?2011  . CESAREAN SECTION  1976; 1980; 1982  . TOTAL KNEE ARTHROPLASTY Right 04/22/2013  . TOTAL KNEE ARTHROPLASTY Right 04/22/2013   Procedure: TOTAL KNEE ARTHROPLASTY;  Surgeon: Cammy Copa, MD;  Location: [facility] OR;  Service: Orthopedics;  Laterality: Right;  Right Total Knee Arthroplasty    OB History    No data available       Home Medications    Prior to Admission medications   Medication Sig Start Date End Date Taking? Authorizing Provider  aspirin 81 MG chewable tablet Chew 81 mg by  mouth daily.    Historical Provider, MD  benzocaine-resorcinol (VAGISIL) 5-2 % vaginal cream Place vaginally at bedtime. 04/23/15   Dione Booze, MD  benzonatate (TESSALON) 100 MG capsule  02/24/15   Historical Provider, MD  cephALEXin (KEFLEX) 500 MG capsule Take 1 capsule (500 mg total) by mouth 2 (two) times daily. 03/04/14   Rodolph Bong, MD  Cholecalciferol (VITAMIN D3) 400 UNITS CAPS Take 1 capsule by mouth daily.    Historical Provider, MD  diphenhydrAMINE (BENADRYL) 25 mg capsule Take 1 capsule (25 mg total) by mouth every 6 (six) hours as needed. 07/03/15   Cheri Fowler, PA-C  ferrous sulfate 325 (65 FE) MG tablet Take 325 mg by mouth daily with breakfast.    Historical Provider, MD  fluconazole (DIFLUCAN) 150 MG tablet Take 1 tablet (150 mg total) by mouth once. 04/23/15   Dione Booze, MD  Garlic 1000 MG CAPS Take 1 capsule by mouth daily.    Historical Provider, MD  ibuprofen (ADVIL,MOTRIN) 800 MG tablet Take 1 tablet (800 mg total) by mouth 3 (three) times daily. 06/14/16   Lucia Estelle, NP  ipratropium (ATROVENT) 0.06 % nasal spray Place 2 sprays into both nostrils 4 (four) times daily. 06/03/15   Hayden Rasmussen, NP  nitrofurantoin, macrocrystal-monohydrate, (MACROBID) 100 MG capsule Take 1 capsule (100  mg total) by mouth 2 (two) times daily. X 7 days 10/31/15   Dione Booze, MD  potassium chloride SA (K-DUR,KLOR-CON) 20 MEQ tablet Take 1 tablet (20 mEq total) by mouth once. 10/31/15   Dione Booze, MD  sucralfate (CARAFATE) 1 GM/10ML suspension Take 10 mLs (1 g total) by mouth 4 (four) times daily -  with meals and at bedtime. 12/30/16   Earley Favor, NP  triamterene-hydrochlorothiazide (MAXZIDE) 75-50 MG per tablet Take 0.5 tablets by mouth daily.    Historical Provider, MD    Family History Family History  Problem Relation Age of Onset  . Hypertension Mother   . Diabetes Mother   . Alzheimer's disease Father   . Cancer Father     Prostate    Social History Social History  Substance Use Topics   . Smoking status: Never Smoker  . Smokeless tobacco: Never Used  . Alcohol use No     Allergies   Patient has no known allergies.   Review of Systems Review of Systems  Constitutional: Negative for chills and fever.  HENT: Negative for rhinorrhea, sore throat and trouble swallowing.   Respiratory: Negative for cough and shortness of breath.   Cardiovascular: Positive for chest pain.  Gastrointestinal: Negative for nausea and vomiting.  Musculoskeletal: Positive for arthralgias.  Skin: Negative for rash and wound.  Neurological: Negative for weakness and numbness.     Physical Exam Updated Vital Signs BP 128/91   Pulse 75   Temp 98 F (36.7 C) (Oral)   Resp (!) 29   Ht 5' (1.524 m)   Wt 65.3 kg   SpO2 98%   BMI 28.12 kg/m   Physical Exam  Constitutional: She appears well-developed and well-nourished. No distress.  HENT:  Head: Normocephalic.  Nose: Nose normal.  Eyes: Pupils are equal, round, and reactive to light.  Neck: Normal range of motion.  Cardiovascular: Normal rate.   Pulmonary/Chest: Effort normal.  Abdominal: Soft. She exhibits no distension. There is no tenderness.  Musculoskeletal: Normal range of motion.  Neurological: She is alert.  Skin: No rash noted. No erythema.  Nursing note and vitals reviewed.    ED Treatments / Results  Labs (all labs ordered are listed, but only abnormal results are displayed) Labs Reviewed  BASIC METABOLIC PANEL - Abnormal; Notable for the following:       Result Value   Glucose, Bld 116 (*)    All other components within normal limits  CBC  I-STAT TROPOININ, ED    EKG  EKG Interpretation None       Radiology Dg Chest 2 View  Result Date: 12/30/2016 CLINICAL DATA:  Chest pain beginning this morning which shortness-of-breath. EXAM: CHEST  2 VIEW COMPARISON:  07/25/2013 and 04/16/2013 FINDINGS: Lungs are adequately inflated and otherwise clear. Cardiomediastinal silhouette is within normal. There is  mild calcified plaque over the aortic arch. There are minimal degenerative changes of the spine. IMPRESSION: No active cardiopulmonary disease. Aortic atherosclerosis. Electronically Signed   By: Elberta Fortis M.D.   On: 12/30/2016 21:23    Procedures Procedures (including critical care time)  Medications Ordered in ED Medications  gi cocktail (Maalox,Lidocaine,Donnatal) (30 mLs Oral Given 12/30/16 2245)     Initial Impression / Assessment and Plan / ED Course  I have reviewed the triage vital signs and the nursing notes.  Pertinent labs & imaging results that were available during my care of the patient were reviewed by me and considered in my medical decision making (see  chart for details).      Cardiac markers are negative.  I feel that this is more gastric irritation than anything.  She'll be given a GI cocktail and reassessed She received total relief of her symptoms after the GI cocktail.  She's been given a prescription for Carafate to use for the next 3-5 days with follow-up with her primary care physician Final Clinical Impressions(s) / ED Diagnoses   Final diagnoses:  Superficial gastritis without hemorrhage, unspecified chronicity    New Prescriptions New Prescriptions   SUCRALFATE (CARAFATE) 1 GM/10ML SUSPENSION    Take 10 mLs (1 g total) by mouth 4 (four) times daily -  with meals and at bedtime.     Earley FavorGail Annisha Baar, NP 12/30/16 2232    Earley FavorGail Fabien Travelstead, NP 12/30/16 2329    Laurence Spatesachel Morgan Little, MD 01/01/17 [phone]

## 2016-12-30 NOTE — ED Triage Notes (Signed)
Pt to ED for upper centralized chest pain onset today. Pain worsens when belching.  Also reports having pain to back of L arm and R leg that only lasted for a little while then subsided

## 2017-01-19 ENCOUNTER — Ambulatory Visit
Admission: RE | Admit: 2017-01-19 | Discharge: 2017-01-19 | Disposition: A | Discharge status: Home / Self Care | Payer: BLUE CROSS/BLUE SHIELD | Source: Ambulatory Visit | Attending: Family Medicine | Admitting: Family Medicine

## 2017-01-19 DIAGNOSIS — Z1231 Encounter for screening mammogram for malignant neoplasm of breast: Secondary | ICD-10-CM

## 2017-08-05 IMAGING — MG 2D DIGITAL SCREENING BILATERAL MAMMOGRAM WITH CAD AND ADJUNCT TO
8 of 14 series · 8 of 30 positions shown · non-contrast
Comparison: Previous exam(s).

CLINICAL DATA: Screening.

EXAM:
2D DIGITAL SCREENING BILATERAL MAMMOGRAM WITH CAD AND ADJUNCT TOMO

[R CC (1 of 2)]
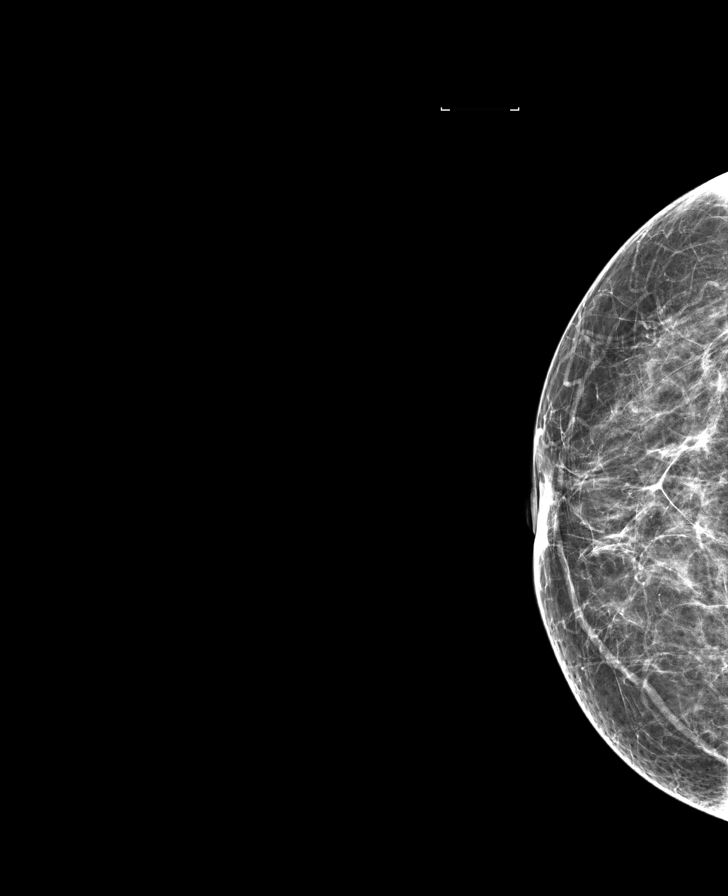

[L CC (1 of 2)]
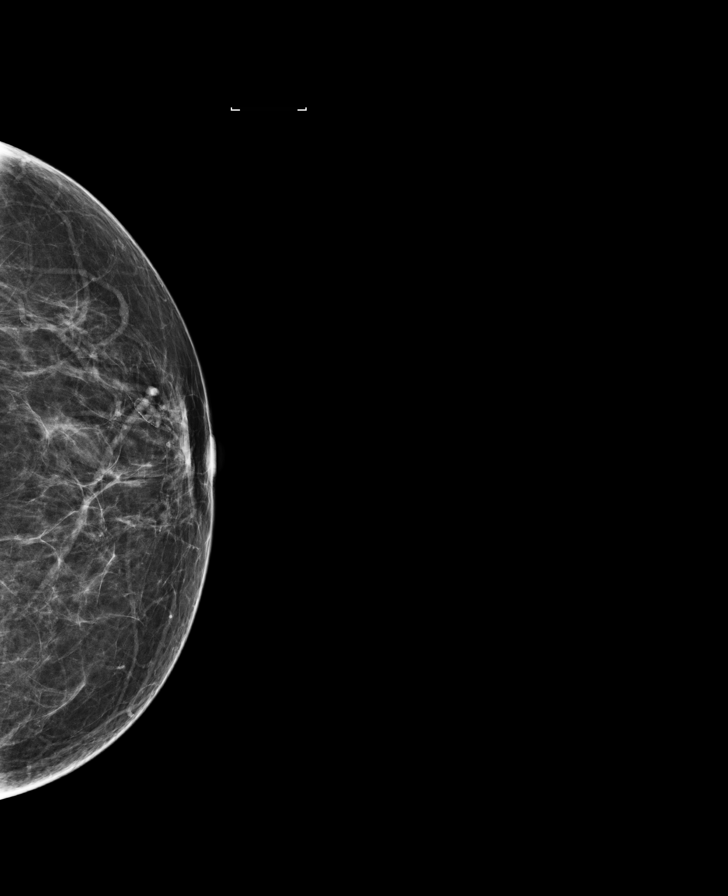

[R CC (2 of 2)]
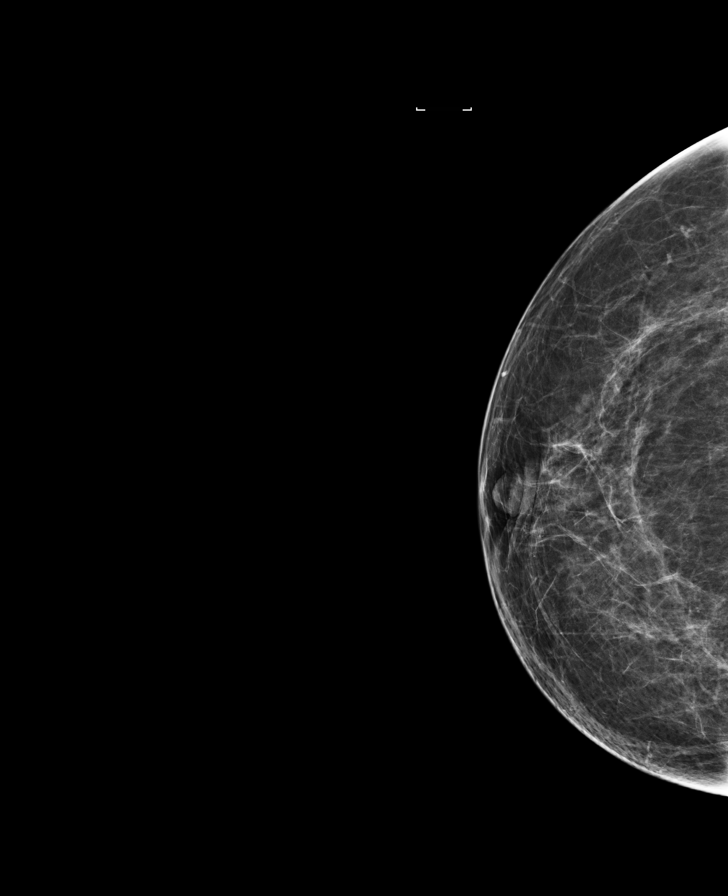

[R MLO synth-2D]
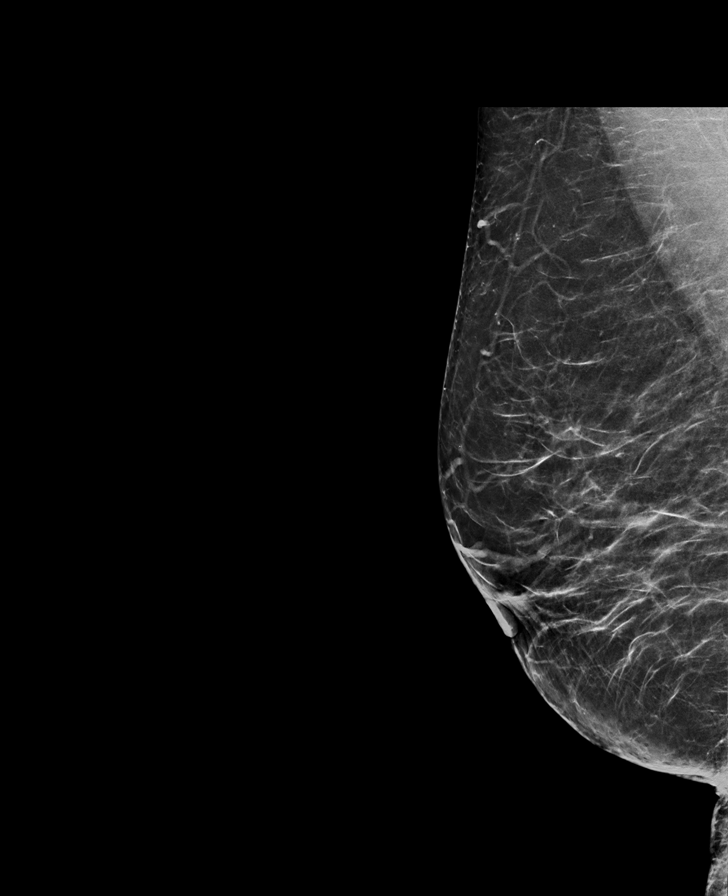

[L MLO synth-2D]
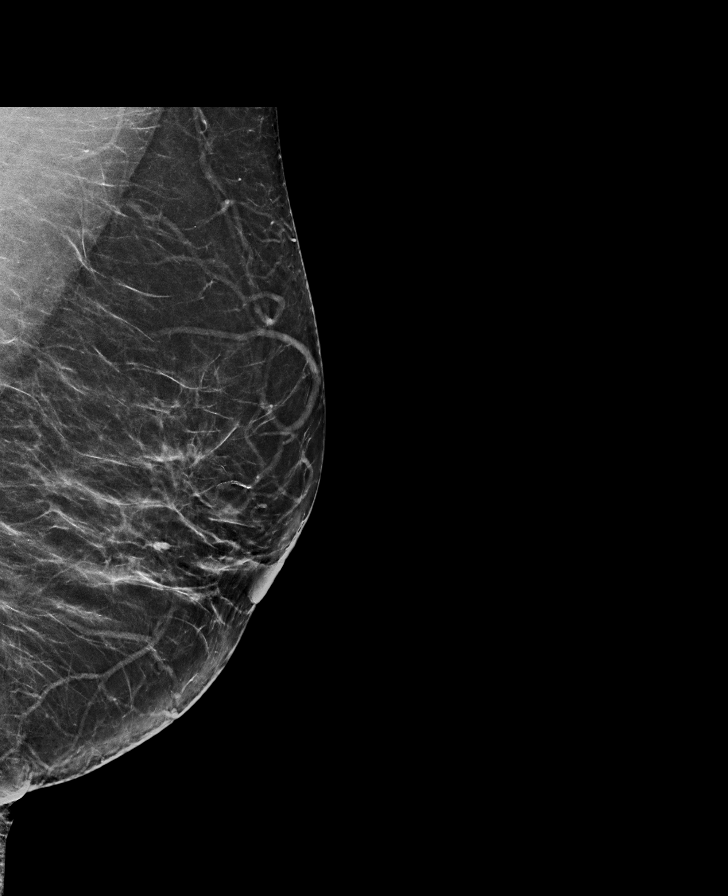

[L MLO]
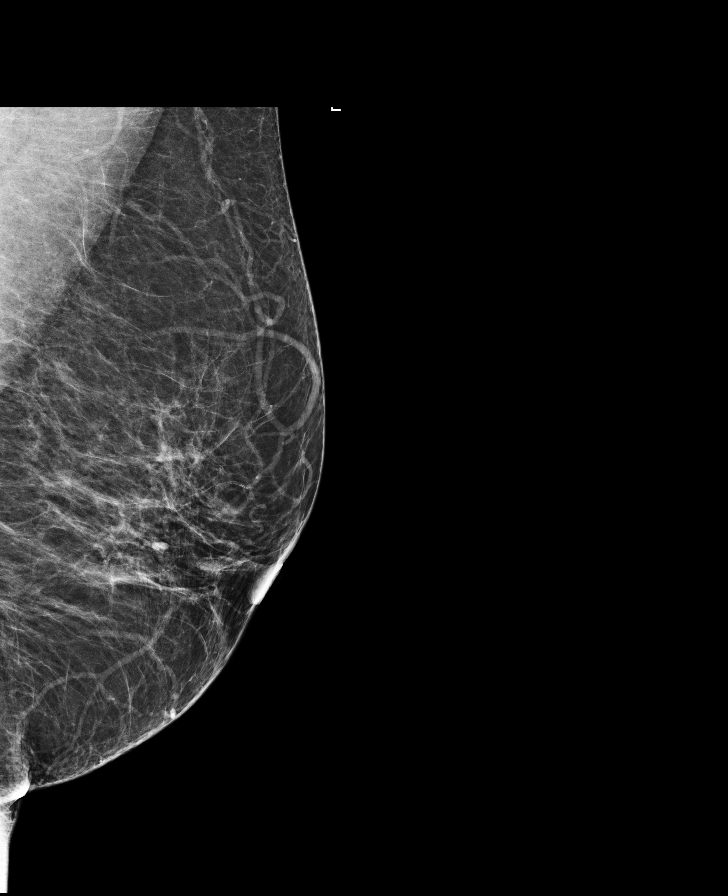

[R MLO]
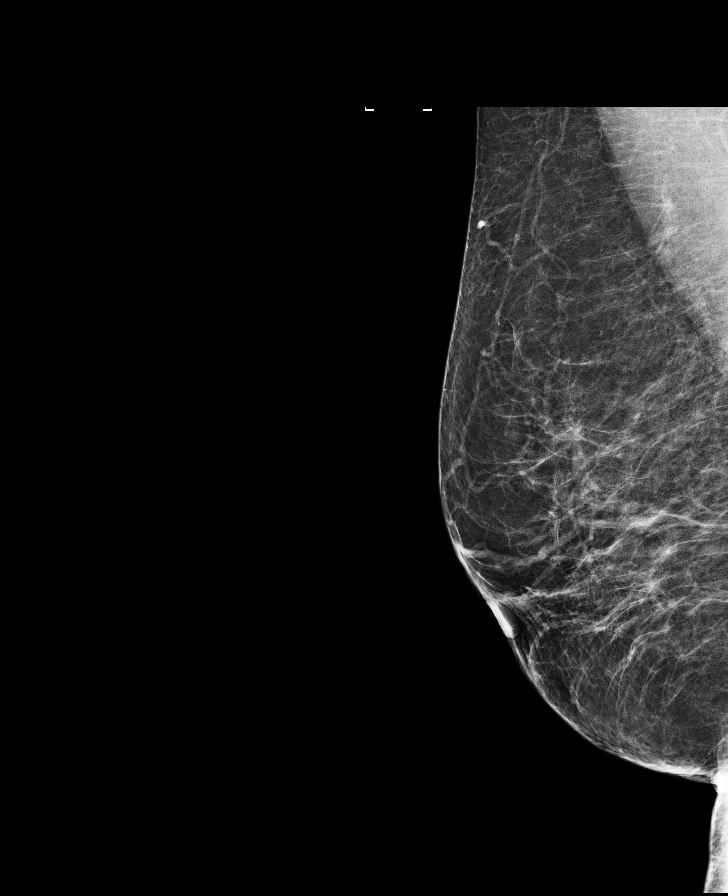

[L CC (2 of 2)]
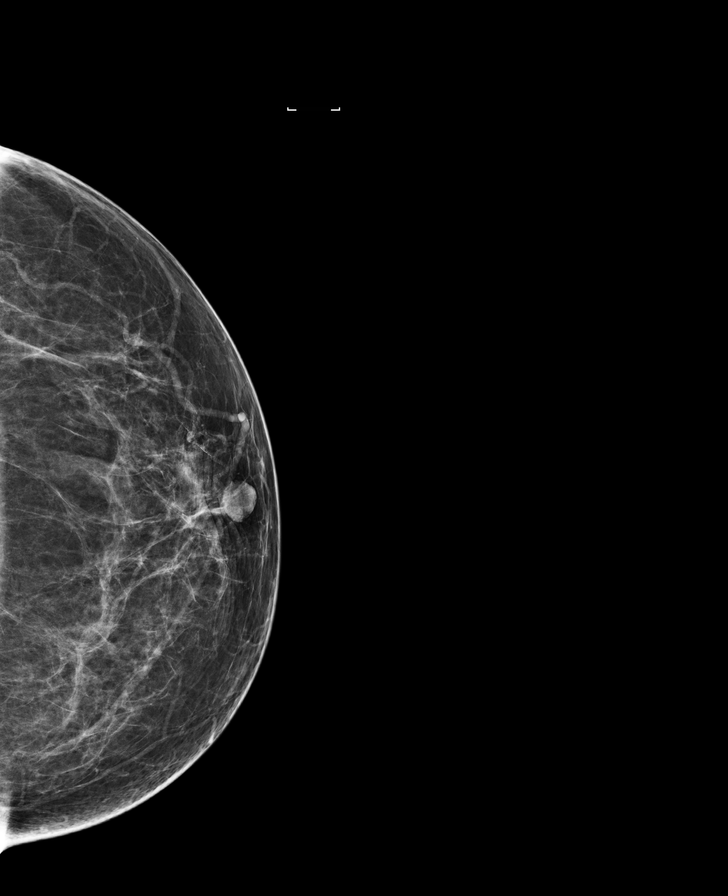

[8 of 30 positions shown; findings below may reference images not displayed]

ACR Breast Density Category b: There are scattered areas of
fibroglandular density.
FINDINGS: There are no findings suspicious for malignancy. Images were
processed with CAD.
IMPRESSION: No mammographic evidence of malignancy. A result letter of this
screening mammogram will be mailed directly to the patient.

RECOMMENDATION:
Screening mammogram in one year. (Code:97-6-RS4)

BI-RADS CATEGORY  1: Negative.

## 2018-01-28 ENCOUNTER — Other Ambulatory Visit: Payer: Self-pay | Admitting: Family Medicine

## 2018-01-28 DIAGNOSIS — Z1231 Encounter for screening mammogram for malignant neoplasm of breast: Secondary | ICD-10-CM

## 2018-02-26 ENCOUNTER — Ambulatory Visit: Payer: BLUE CROSS/BLUE SHIELD

## 2018-03-20 ENCOUNTER — Ambulatory Visit: Payer: BLUE CROSS/BLUE SHIELD

## 2018-04-19 ENCOUNTER — Ambulatory Visit
Admission: RE | Admit: 2018-04-19 | Discharge: 2018-04-19 | Disposition: A | Discharge status: Home / Self Care | Payer: BLUE CROSS/BLUE SHIELD | Source: Ambulatory Visit | Attending: Family Medicine | Admitting: Family Medicine

## 2018-04-19 DIAGNOSIS — Z1231 Encounter for screening mammogram for malignant neoplasm of breast: Secondary | ICD-10-CM

## 2018-06-14 ENCOUNTER — Ambulatory Visit (HOSPITAL_COMMUNITY)
Admission: EM | Admit: 2018-06-14 | Discharge: 2018-06-14 | Disposition: A | Discharge status: Home / Self Care | Payer: BLUE CROSS/BLUE SHIELD | Attending: Family Medicine | Admitting: Family Medicine

## 2018-06-14 ENCOUNTER — Telehealth (HOSPITAL_COMMUNITY): Payer: Self-pay | Admitting: *Deleted

## 2018-06-14 ENCOUNTER — Other Ambulatory Visit: Payer: Self-pay

## 2018-06-14 ENCOUNTER — Encounter (HOSPITAL_COMMUNITY): Payer: Self-pay

## 2018-06-14 DIAGNOSIS — J22 Unspecified acute lower respiratory infection: Secondary | ICD-10-CM | POA: Diagnosis not present

## 2018-06-14 MED ORDER — BENZONATATE 200 MG PO CAPS: 200.0000 mg | ORAL_CAPSULE | Freq: Two times a day (BID) | ORAL | 0 refills | Status: DC | PRN

## 2018-06-14 MED ORDER — AZITHROMYCIN 250 MG PO TABS: 250.0000 mg | ORAL_TABLET | Freq: Every day | ORAL | 0 refills | Status: DC

## 2018-06-14 NOTE — ED Provider Notes (Signed)
[facility]    CSN: [id number] Arrival date & time: 06/14/18  1850     History   Chief Complaint Chief Complaint  Patient presents with  . Influenza  . Sore Throat    HPI Rhonda Wells is a 62 y.o. female.   HPI  Healthy 62 year old woman.  Works in a daycare.  No underlying asthma or allergies.  She had a respiratory infection for over a week.  Today is her eighth day.  She has had cough, sputum, postnasal drip, fever and chills, headache and sore throat.  She states it felt like it was a little better early in the week but now is getting worse again.  Yesterday she had chills.  This morning she coughed up sputum with blood streaks in it.  No chest pain.  No shortness of breath.  No asthma or wheezing.  She is tried Mucinex DM.  It has not helped.  She has had flu shots yearly.  She has not had a pneumonia shot.  She states that she has well-controlled hypertension and high cholesterol.  She is under the care of her primary care doctor and is compliant with her treatment.  Past Medical History:  Diagnosis Date  . Anemia   . Arthritis    "right knee; fingers" (04/23/2013)  . Hypercholesteremia   . Hypertension   . Shortness of breath    hx fluid in lungs  20's  . Sickle cell trait (HCC)   . Stroke [facility] ~ 2003   tia     Patient Active Problem List   Diagnosis Date Noted  . Allergic state 06/30/2015  . BP (high blood pressure) 06/30/2015  . Arthritis of knee, degenerative 06/30/2015  . HLD (hyperlipidemia) 04/17/2013    Past Surgical History:  Procedure Laterality Date  . ANAL FISSURECTOMY  ?2011  . CESAREAN SECTION  1976; 1980; 1982  . TOTAL KNEE ARTHROPLASTY Right 04/22/2013  . TOTAL KNEE ARTHROPLASTY Right 04/22/2013   Procedure: TOTAL KNEE ARTHROPLASTY;  Surgeon: Cammy CopaGregory Scott Dean, MD;  [facility];  Service: Orthopedics;  Laterality: Right;  Right Total Knee Arthroplasty    OB History   None      Home Medications    Prior to Admission  medications   Medication Sig Start Date End Date Taking? Authorizing Provider  aspirin 81 MG chewable tablet Chew 81 mg by mouth daily.    [provider]  azithromycin (ZITHROMAX) 250 MG tablet Take 1 tablet (250 mg total) by mouth daily. Take first 2 tablets together, then 1 every day until finished. 06/14/18   Rhonda MooreNelson, Katrenia Alkins Sue, MD  benzonatate (TESSALON) 200 MG capsule Take 1 capsule (200 mg total) by mouth 2 (two) times daily as needed for cough. 06/14/18   Rhonda MooreNelson, Sura Canul Sue, MD  Cholecalciferol (VITAMIN D3) 400 UNITS CAPS Take 1 capsule by mouth daily.    [provider]  ferrous sulfate 325 (65 FE) MG tablet Take 325 mg by mouth daily with breakfast.    [provider]  Garlic 1000 MG CAPS Take 1 capsule by mouth daily.    [provider]  ipratropium (ATROVENT) 0.06 % nasal spray Place 2 sprays into both nostrils 4 (four) times daily. 06/03/15   Hayden RasmussenMabe, David, NP  potassium chloride SA (K-DUR,KLOR-CON) 20 MEQ tablet Take 1 tablet (20 mEq total) by mouth once. 10/31/15   Dione BoozeGlick, David, MD  sucralfate (CARAFATE) 1 GM/10ML suspension Take 10 mLs (1 g total) by mouth 4 (four) times  daily -  with meals and at bedtime. 12/30/16   Earley Favor, NP  triamterene-hydrochlorothiazide (MAXZIDE) 75-50 MG per tablet Take 0.5 tablets by mouth daily.    [provider]    Family History Family History  Problem Relation Age of Onset  . Hypertension Mother   . Diabetes Mother   . Alzheimer's disease Father   . Cancer Father        Prostate  . Breast cancer Neg Hx     Social History Social History   Tobacco Use  . Smoking status: Never Smoker  . Smokeless tobacco: Never Used  Substance Use Topics  . Alcohol use: No  . Drug use: No     Allergies   Patient has no known allergies.   Review of Systems Review of Systems  Constitutional: Positive for chills, fatigue and fever.  HENT: Positive for congestion, postnasal drip, rhinorrhea, sinus pressure  and sore throat. Negative for dental problem and ear pain.   Eyes: Negative for pain and visual disturbance.  Respiratory: Positive for cough. Negative for shortness of breath.   Cardiovascular: Negative for chest pain and palpitations.  Gastrointestinal: Negative for abdominal pain and vomiting.  Genitourinary: Negative for dysuria and hematuria.  Musculoskeletal: Negative for arthralgias and back pain.  Skin: Negative for color change and rash.  Neurological: Negative for seizures and syncope.  Psychiatric/Behavioral: Positive for sleep disturbance.  All other systems reviewed and are negative.    Physical Exam Triage Vital Signs ED Triage Vitals  Enc Vitals Group     BP 06/14/18 1940 (!) 130/97     Pulse Rate 06/14/18 1937 82     Resp 06/14/18 1937 18     Temp 06/14/18 1937 98.7 F (37.1 C)     Temp src --      SpO2 06/14/18 1937 100 %     Weight 06/14/18 1939 156 lb (70.8 kg)     Height --      Head Circumference --      Peak Flow --      Pain Score 06/14/18 1939 9     Pain Loc --      Pain Edu? --      Excl. in GC? --    No data found.  Updated Vital Signs BP (!) 130/97   Pulse 82   Temp 98.7 F (37.1 C)   Resp 18   Wt 70.8 kg   SpO2 100%   BMI 30.47 kg/m   Visual Acuity Right Eye Distance:   Left Eye Distance:   Bilateral Distance:    Right Eye Near:   Left Eye Near:    Bilateral Near:     Physical Exam  Constitutional: She appears well-developed and well-nourished. No distress.  HENT:  Head: Normocephalic and atraumatic.  Right Ear: Tympanic membrane and ear canal normal.  Left Ear: Tympanic membrane and ear canal normal.  Mouth/Throat: Uvula is midline. Posterior oropharyngeal erythema present. Tonsils are 0 on the right. Tonsils are 0 on the left.  Mild tenderness sinuses.  Uvula slightly swollen.  Eyes: Pupils are equal, round, and reactive to light. Conjunctivae are normal.  Neck: Normal range of motion. Neck supple.  Cardiovascular: Normal  rate, regular rhythm and normal heart sounds.  Pulmonary/Chest: Effort normal and breath sounds normal. No respiratory distress.  Mild central rhonchi that clear with cough.  No wheeze or rales  Abdominal: Soft. Bowel sounds are normal. She exhibits no distension.  Musculoskeletal: Normal range of motion. She  exhibits no edema.  Lymphadenopathy:    She has no cervical adenopathy.  Neurological: She is alert.  Skin: Skin is warm and dry.  Psychiatric: She has a normal mood and affect. Her behavior is normal.     UC Treatments / Results  Labs (all labs ordered are listed, but only abnormal results are displayed) Labs Reviewed - No data to display  EKG None  Radiology No results found.  Procedures Procedures (including critical care time)  Medications Ordered in UC Medications - No data to display  Initial Impression / Assessment and Plan / UC Course  I have reviewed the triage vital signs and the nursing notes.  Pertinent labs & imaging results that were available during my care of the patient were reviewed by me and considered in my medical decision making (see chart for details).     Discussed this initially was a viral infection with bronchitis.  Because it is been going on longer than expected, with worsening symptoms, and mild hemoptysis I am inclined to give her an antibiotic.  I do not hear any rales or signs of pneumonia. Final Clinical Impressions(s) / UC Diagnoses   Final diagnoses:  Lower resp. tract infection     Discharge Instructions     Drink plenty of fluids Use a humidifier if you have one Take azithromycin per package instructions Take Tessalon twice a day for cough Get plenty of rest Off work until Monday    ED Prescriptions    Medication Sig Dispense Auth. Provider   benzonatate (TESSALON) 200 MG capsule Take 1 capsule (200 mg total) by mouth 2 (two) times daily as needed for cough. 20 capsule Rhonda Moore, MD   azithromycin  (ZITHROMAX) 250 MG tablet Take 1 tablet (250 mg total) by mouth daily. Take first 2 tablets together, then 1 every day until finished. 6 tablet Rhonda Moore, MD     Controlled Substance Prescriptions [place] Controlled Substance Registry consulted? Not Applicable   Rhonda Moore, MD 06/14/18 2027

## 2018-06-14 NOTE — Discharge Instructions (Signed)
Drink plenty of fluids Use a humidifier if you have one Take azithromycin per package instructions Take Tessalon twice a day for cough Get plenty of rest Off work until Monday

## 2018-06-14 NOTE — ED Triage Notes (Signed)
Flu symptoms and sore throat. 1 week

## 2018-06-18 ENCOUNTER — Encounter (HOSPITAL_COMMUNITY): Payer: Self-pay

## 2018-06-18 ENCOUNTER — Ambulatory Visit (HOSPITAL_COMMUNITY)
Admission: EM | Admit: 2018-06-18 | Discharge: 2018-06-18 | Disposition: A | Discharge status: Home / Self Care | Payer: BLUE CROSS/BLUE SHIELD | Attending: Family Medicine | Admitting: Family Medicine

## 2018-06-18 DIAGNOSIS — R0789 Other chest pain: Secondary | ICD-10-CM

## 2018-06-18 MED ORDER — NAPROXEN 500 MG PO TBEC: 500.0000 mg | DELAYED_RELEASE_TABLET | Freq: Two times a day (BID) | ORAL | 0 refills | Status: DC

## 2018-06-18 NOTE — ED Provider Notes (Signed)
  [facility]    CSN: [id number] Arrival date & time: 06/18/18  1130   Chief Complaint  Patient presents with  . Chest Pain  . Headache    Rhonda Wells is a 62 y.o. female here for evaluation of chest pain.  Duration of issue: 1 day Quality: dull Palliation: None Provocation: moving L arm, coughing; not brought on by physical exertion Severity: 6/10 Radiation: none Duration of chest pain: several moments Associated symptoms: HA Cardiac history: HTN, stroke Family heart history: mom w HTN and DM Smoker? No  ROS:  Cardiac: +CP Lungs: No SOB, +cough  Past Medical History:  Diagnosis Date  . Anemia   . Arthritis    "right knee; fingers" (04/23/2013)  . Hypercholesteremia   . Hypertension   . Shortness of breath    hx fluid in lungs  20's  . Sickle cell trait (HCC)   . Stroke [facility](HCC) ~ 2003   tia    Family History  Problem Relation Age of Onset  . Hypertension Mother   . Diabetes Mother   . Alzheimer's disease Father   . Cancer Father        Prostate  . Breast cancer Neg Hx       BP 112/62 (BP Location: Left Arm)   Pulse 99   Temp 98.7 F (37.1 C) (Oral)   Resp 19   SpO2 95%  Gen: awake, alert, appears stated age HEENT: PERRLA, MMM Neck: No masses or asymmetry Heart: RRR, no bruits, no LE edema Lungs: CTAB, no accessory muscle use Abd: Soft, NT, ND, no masses or organomegaly MSK: chest pain is reproducible to palptation Psych: Age appropriate judgment and insight, nml mood and affect  Final Clinical Impressions(s) / UC Diagnoses   Final diagnoses:  Chest wall pain     Discharge Instructions     OK to take Tylenol 1000 mg (2 extra strength tabs) or 975 mg (3 regular strength tabs) every 6 hours as needed.  Stretch the pectoral/chest muscles.      ED Prescriptions    Medication Sig Dispense Auth. Provider   naproxen (EC NAPROSYN) 500 MG EC tablet Take 1 tablet (500 mg total) by mouth 2 (two) times daily with a meal. 20 tablet  Sharlene DoryWendling, Alyus Mofield Paul, DO     Controlled Substance Prescriptions [place] Controlled Substance Registry consulted? Not Applicable   EKG reviewed. Neg. Atypical, reproducible. Tx msk etiology, likely 2/2 coughing from URI. Fu with pcp prn. Pt voiced understanding and agreement to the plan.   Sharlene DoryWendling, Thor Nannini Paul, DO 06/18/18 1243

## 2018-06-18 NOTE — ED Triage Notes (Signed)
Pt presents with generalized chest pain and headache that has been ongoing since yesterday with no relief.

## 2018-06-18 NOTE — Discharge Instructions (Signed)
OK to take Tylenol 1000 mg (2 extra strength tabs) or 975 mg (3 regular strength tabs) every 6 hours as needed.  Stretch the pectoral/chest muscles.

## 2019-03-28 ENCOUNTER — Other Ambulatory Visit: Payer: Self-pay | Admitting: Nurse Practitioner

## 2019-03-28 ENCOUNTER — Other Ambulatory Visit: Payer: Self-pay | Admitting: Family Medicine

## 2019-03-28 DIAGNOSIS — Z1231 Encounter for screening mammogram for malignant neoplasm of breast: Secondary | ICD-10-CM

## 2019-05-16 ENCOUNTER — Ambulatory Visit: Payer: BLUE CROSS/BLUE SHIELD

## 2019-10-07 ENCOUNTER — Other Ambulatory Visit: Payer: Self-pay

## 2019-10-07 DIAGNOSIS — Z20822 Contact with and (suspected) exposure to covid-19: Secondary | ICD-10-CM

## 2019-10-09 LAB — NOVEL CORONAVIRUS, NAA: SARS-CoV-2, NAA: NOT DETECTED

## 2019-10-11 ENCOUNTER — Telehealth: Payer: Self-pay

## 2019-10-11 NOTE — Telephone Encounter (Signed)

## 2022-12-29 ENCOUNTER — Ambulatory Visit (HOSPITAL_COMMUNITY)
Admission: EM | Admit: 2022-12-29 | Discharge: 2022-12-29 | Disposition: A | Discharge status: Home / Self Care | Payer: Medicare Other | Attending: Emergency Medicine | Admitting: Emergency Medicine

## 2022-12-29 ENCOUNTER — Encounter (HOSPITAL_COMMUNITY): Payer: Self-pay

## 2022-12-29 DIAGNOSIS — U071 COVID-19: Secondary | ICD-10-CM | POA: Diagnosis not present

## 2022-12-29 DIAGNOSIS — R519 Headache, unspecified: Secondary | ICD-10-CM | POA: Diagnosis present

## 2022-12-29 DIAGNOSIS — M545 Low back pain, unspecified: Secondary | ICD-10-CM | POA: Diagnosis present

## 2022-12-29 DIAGNOSIS — J029 Acute pharyngitis, unspecified: Secondary | ICD-10-CM | POA: Diagnosis present

## 2022-12-29 DIAGNOSIS — J069 Acute upper respiratory infection, unspecified: Secondary | ICD-10-CM

## 2022-12-29 DIAGNOSIS — R300 Dysuria: Secondary | ICD-10-CM | POA: Diagnosis not present

## 2022-12-29 LAB — POCT URINALYSIS DIPSTICK, ED / UC
Bilirubin Urine: NEGATIVE
Glucose, UA: NEGATIVE mg/dL
Ketones, ur: NEGATIVE mg/dL
Leukocytes,Ua: NEGATIVE
Nitrite: NEGATIVE
Protein, ur: NEGATIVE mg/dL
Specific Gravity, Urine: 1.015 (ref 1.005–1.030)
Urobilinogen, UA: 0.2 mg/dL (ref 0.0–1.0)
pH: 6 (ref 5.0–8.0)

## 2022-12-29 MED ORDER — ALBUTEROL SULFATE HFA 108 (90 BASE) MCG/ACT IN AERS: 1.0000 | INHALATION_SPRAY | Freq: Four times a day (QID) | RESPIRATORY_TRACT | 0 refills | Status: DC | PRN

## 2022-12-29 MED ORDER — PROMETHAZINE-DM 6.25-15 MG/5ML PO SYRP: 5.0000 mL | ORAL_SOLUTION | Freq: Three times a day (TID) | ORAL | 0 refills | Status: DC | PRN

## 2022-12-29 NOTE — Discharge Instructions (Signed)
Overall your physical exam is reassuring.  Your symptoms appear consistent with a viral upper respiratory infection.  We have swabbed you for COVID-19 and we will call if positive, as you are in the window for antiviral treatment.  Your urinalysis was negative for infection, you have no signs of a urinary tract infection.  Please use albuterol inhaler as needed for cough or shortness of breath.  You can use the promethazine syrup as needed for cough at night.  Please do not drink or drive on this medication as it can cause drowsiness.  Please return to clinic or seek immediate care if you develop shortness of breath, chest pain, continued fevers despite medication, or no improvement over the next week.

## 2022-12-29 NOTE — ED Provider Notes (Signed)
[place]    CSN: [id number] Arrival date & time: 12/29/22  1723      History   Chief Complaint Chief Complaint  Patient presents with   Headache   Nasal Congestion   Generalized Body Aches   Sore Throat   Cough    HPI Rhonda Wells is a 67 y.o. female.   Reports sore throat, headache, nasal congestion, ear itching that she scratches with a q-tip for the past 4 days. Reports white phglem to cough, denies SOB, wheezing or chest pain. Had a fever initially, 3 days ago. Does have bilateral lower back pain that is triggered by standing from sitting position. Reports urinating at work today and it felt different, denies dysuria or hematuria, but it is 'just different.' Has not been sexually active since 2019, no concern for STIs. Denies changes in discharge, vaginal pain or sores.   Wanted to see if she had Covid-19, works at a daycare. Does not smoke, no hx of asthma or COPD.   The history is provided by the patient.  Headache Associated symptoms: back pain, cough, fever and sore throat   Associated symptoms: no abdominal pain, no ear pain, no eye pain, no seizures and no vomiting   Sore Throat Associated symptoms include headaches. Pertinent negatives include no chest pain, no abdominal pain and no shortness of breath.  Cough Associated symptoms: fever, headaches, rhinorrhea and sore throat   Associated symptoms: no chest pain, no chills, no ear pain, no rash, no shortness of breath and no wheezing     Past Medical History:  Diagnosis Date   Anemia    Arthritis    "right knee; fingers" (04/23/2013)   Hypercholesteremia    Hypertension    Shortness of breath    hx fluid in lungs  20's   Sickle cell trait ([place])    Stroke ([place]) ~ 2003   tia     Patient Active Problem List   Diagnosis Date Noted   Allergic state 06/30/2015   BP (high blood pressure) 06/30/2015   Arthritis of knee, degenerative 06/30/2015   HLD (hyperlipidemia) 04/17/2013    Past  Surgical History:  Procedure Laterality Date   ANAL FISSURECTOMY  ?2011   [place]; 1980; 1982   TOTAL KNEE ARTHROPLASTY Right 04/22/2013   TOTAL KNEE ARTHROPLASTY Right 04/22/2013   Procedure: TOTAL KNEE ARTHROPLASTY;  Surgeon: Meredith Pel, MD;  Location: [facility];  Service: Orthopedics;  Laterality: Right;  Right Total Knee Arthroplasty    OB History   No obstetric history on file.      Home Medications    Prior to Admission medications   Medication Sig Start Date End Date Taking? Authorizing Provider  albuterol (VENTOLIN HFA) 108 (90 Base) MCG/ACT inhaler Inhale 1-2 puffs into the lungs every 6 (six) hours as needed for wheezing or shortness of breath. 12/29/22  Yes Louretta Shorten, [place] N, FNP  promethazine-dextromethorphan (PROMETHAZINE-DM) 6.25-15 MG/5ML syrup Take 5 mLs by mouth 3 (three) times daily as needed for cough. 12/29/22  Yes Louretta Shorten, [place] N, FNP  aspirin 81 MG chewable tablet Chew 81 mg by mouth daily.    [provider]  Cholecalciferol (VITAMIN D3) 400 UNITS CAPS Take 1 capsule by mouth daily.    [provider]  ferrous sulfate 325 (65 FE) MG tablet Take 325 mg by mouth daily with breakfast.    [provider]  Garlic 123XX123 MG CAPS Take 1 capsule by mouth daily.  [provider]  ipratropium (ATROVENT) 0.06 % nasal spray Place 2 sprays into both nostrils 4 (four) times daily. 06/03/15   Janne Napoleon, NP  triamterene-hydrochlorothiazide (MAXZIDE) 75-50 MG per tablet Take 0.5 tablets by mouth daily.    [provider]    Family History Family History  Problem Relation Age of Onset   Hypertension Mother    Diabetes Mother    Alzheimer's disease Father    Cancer Father        Prostate   Breast cancer Neg Hx     Social History Social History   Tobacco Use   Smoking status: Never   Smokeless tobacco: Never  Vaping Use   Vaping Use: Never used  Substance Use Topics   Alcohol use: No   Drug use: No      Allergies   Patient has no known allergies.   Review of Systems Review of Systems  Constitutional:  Positive for fever. Negative for chills.  HENT:  Positive for rhinorrhea and sore throat. Negative for ear pain and voice change.   Eyes:  Negative for pain and visual disturbance.  Respiratory:  Positive for cough. Negative for shortness of breath and wheezing.   Cardiovascular:  Negative for chest pain and palpitations.  Gastrointestinal:  Negative for abdominal pain and vomiting.  Genitourinary:  Positive for dysuria. Negative for hematuria.  Musculoskeletal:  Positive for back pain. Negative for arthralgias.  Skin:  Negative for color change and rash.  Neurological:  Positive for headaches. Negative for seizures and syncope.  All other systems reviewed and are negative.    Physical Exam Triage Vital Signs ED Triage Vitals [12/29/22 1844]  Enc Vitals Group     BP (!) 144/81     Pulse Rate 72     Resp 16     Temp 98.1 F (36.7 C)     Temp Source Oral     SpO2 96 %     Weight      Height      Head Circumference      Peak Flow      Pain Score 3     Pain Loc      Pain Edu?      Excl. in [place]?    No data found.  Updated Vital Signs BP (!) 144/81 (BP Location: Left Arm)   Pulse 72   Temp 98.1 F (36.7 C) (Oral)   Resp 16   SpO2 96%   Visual Acuity Right Eye Distance:   Left Eye Distance:   Bilateral Distance:    Right Eye Near:   Left Eye Near:    Bilateral Near:     Physical Exam Vitals and nursing note reviewed.  Constitutional:      General: She is not in acute distress.    Appearance: Normal appearance. She is well-developed.     Comments: Pleasant 67 year old female who appears stated age.  HENT:     Head: Normocephalic and atraumatic.     Right Ear: Tympanic membrane and ear canal normal. No drainage or swelling.     Left Ear: Tympanic membrane and ear canal normal. No drainage or swelling.     Nose: Congestion and rhinorrhea present.      Mouth/Throat:     Mouth: Mucous membranes are moist.     Pharynx: Uvula midline. Posterior oropharyngeal erythema present. No pharyngeal swelling, oropharyngeal exudate or uvula swelling.     Tonsils: No tonsillar exudate or tonsillar abscesses.  Eyes:  Conjunctiva/sclera: Conjunctivae normal.  Cardiovascular:     Rate and Rhythm: Normal rate and regular rhythm.     Heart sounds: Normal heart sounds, S1 normal and S2 normal. No murmur heard. Pulmonary:     Effort: Pulmonary effort is normal. No respiratory distress.     Breath sounds: Normal breath sounds.     Comments: Lungs vesicular posteriorly. Abdominal:     Tenderness: There is no right CVA tenderness or left CVA tenderness.  Musculoskeletal:        General: No swelling.     Cervical back: Neck supple.  Lymphadenopathy:     Head:     Right side of head: Submandibular adenopathy present.     Left side of head: Submandibular adenopathy present.     Cervical: Cervical adenopathy present.  Skin:    General: Skin is warm and dry.     Capillary Refill: Capillary refill takes less than 2 seconds.  Neurological:     Mental Status: She is alert and oriented to person, place, and time.  Psychiatric:        Mood and Affect: Mood normal.        Behavior: Behavior is cooperative.      UC Treatments / Results  Labs (all labs ordered are listed, but only abnormal results are displayed) Labs Reviewed  POCT URINALYSIS DIPSTICK, ED / UC - Abnormal; Notable for the following components:      Result Value   Hgb urine dipstick TRACE (*)    All other components within normal limits  SARS CORONAVIRUS 2 (TAT 6-24 HRS)    EKG   Radiology No results found.  Procedures Procedures (including critical care time)  Medications Ordered in UC Medications - No data to display  Initial Impression / Assessment and Plan / UC Course  I have reviewed the triage vital signs and the nursing notes.  Pertinent labs & imaging results that  were available during my care of the patient were reviewed by me and considered in my medical decision making (see chart for details).  Vitals and triage notes reviewed, patient is hemodynamically stable.  Exam consistent with a viral upper respiratory infection.  Lungs vesicular.  Cervical LAD, posterior pharynx with erythema.  COVID testing obtained, within window to start treatment if positive.  Advised to take Tylenol as needed for aches and pains. Today she noticed some dysuria, urine dipstick negative for infection in clinic.  She has been drinking a lot of juices, educated to drink at least 64 ounces of water daily.  Return precautions and follow-up care discussed, patient verbalized understanding.    Final Clinical Impressions(s) / UC Diagnoses   Final diagnoses:  Viral URI with cough  Dysuria     Discharge Instructions      Overall your physical exam is reassuring.  Your symptoms appear consistent with a viral upper respiratory infection.  We have swabbed you for COVID-19 and we will call if positive, as you are in the window for antiviral treatment.  Your urinalysis was negative for infection, you have no signs of a urinary tract infection.  Please use albuterol inhaler as needed for cough or shortness of breath.  You can use the promethazine syrup as needed for cough at night.  Please do not drink or drive on this medication as it can cause drowsiness.  Please return to clinic or seek immediate care if you develop shortness of breath, chest pain, continued fevers despite medication, or no improvement over the next week.  ED Prescriptions     Medication Sig Dispense Auth. Provider   albuterol (VENTOLIN HFA) 108 (90 Base) MCG/ACT inhaler Inhale 1-2 puffs into the lungs every 6 (six) hours as needed for wheezing or shortness of breath. 18 g Rune Mendez, [place] N, [place]   promethazine-dextromethorphan (PROMETHAZINE-DM) 6.25-15 MG/5ML syrup Take 5 mLs by mouth 3 (three) times daily as  needed for cough. 118 mL Taji Sather, [place] N, [place]      I have reviewed the PDMP during this encounter.   Mane Consolo, [place] N, [place] 12/29/22 1925

## 2022-12-29 NOTE — ED Triage Notes (Signed)
Patient reports that she began having nasal congestion, headache, body aches, a productive cough with white sputum x 4 days. Patient also reports a fever for one day only 3 days ago.  Patient sates she took coricidin for a cough 4 days ago only.

## 2022-12-30 LAB — SARS CORONAVIRUS 2 (TAT 6-24 HRS): SARS Coronavirus 2: POSITIVE — AB

## 2024-04-25 ENCOUNTER — Encounter (HOSPITAL_COMMUNITY): Payer: Self-pay

## 2024-04-25 ENCOUNTER — Ambulatory Visit (HOSPITAL_COMMUNITY)
Admission: EM | Admit: 2024-04-25 | Discharge: 2024-04-25 | Disposition: A | Discharge status: Home / Self Care | Attending: Emergency Medicine | Admitting: Emergency Medicine

## 2024-04-25 DIAGNOSIS — R21 Rash and other nonspecific skin eruption: Secondary | ICD-10-CM

## 2024-04-25 DIAGNOSIS — T148XXA Other injury of unspecified body region, initial encounter: Secondary | ICD-10-CM | POA: Diagnosis not present

## 2024-04-25 MED ORDER — MUPIROCIN CALCIUM 2 % EX CREA: 1.0000 | TOPICAL_CREAM | Freq: Two times a day (BID) | CUTANEOUS | 0 refills | Status: AC

## 2024-04-25 MED ORDER — PREDNISONE 20 MG PO TABS: 40.0000 mg | ORAL_TABLET | Freq: Every day | ORAL | 0 refills | Status: AC

## 2024-04-25 MED ORDER — TRIAMCINOLONE ACETONIDE 0.1 % EX CREA: 1.0000 | TOPICAL_CREAM | Freq: Two times a day (BID) | CUTANEOUS | 0 refills | Status: AC

## 2024-04-25 NOTE — Discharge Instructions (Addendum)
 Start taking 2 tablets of prednisone once daily for 3 days to help with rash and swelling. Apply triamcinolone  cream twice daily to the rash. Apply mupirocin cream twice daily to the open area on your arm for infection prevention.  Keep this area clean dry and covered. You can also take over-the-counter cetirizine  (Zyrtec ) once daily to help with itching. Return here if your symptoms persist or worsen.

## 2024-04-25 NOTE — ED Triage Notes (Signed)
 Patient reports that she began having a rash to the right upper arm 6 days ago, but got worse in the past 3 days.   Patient states she has been applying Neosporin and Calamine lotion to the area.

## 2024-04-25 NOTE — ED Provider Notes (Signed)
 [facility]    CSN: [id number] Arrival date & time: 04/25/24  1748      History   Chief Complaint Chief Complaint  Patient presents with   Rash    HPI Rhonda Wells is a 68 y.o. female.   Patient presents with a rash to right upper arm x 6 days.  Patient states the rash has become worse over the last 3 days.  Patient states that she started to notice some mild swelling to her right upper arm as well.  Patient reports the rash is very itchy.  Patient reports that she has been scratching the area so much that she broke a small area of skin.    Patient reports that she has been applying Neosporin and calamine lotion without relief. Denies fever, body aches, chills.  Patient denies any known exposures to allergens and is unsure what could have caused the rash initially.  The history is provided by the patient and medical records.  Rash   Past Medical History:  Diagnosis Date   Anemia    Arthritis    right knee; fingers (04/23/2013)   Hypercholesteremia    Hypertension    Shortness of breath    hx fluid in lungs  20's   Sickle cell trait (HCC)    Stroke (HCC) ~ 2003   tia     Patient Active Problem List   Diagnosis Date Noted   Allergy 06/30/2015   BP (high blood pressure) 06/30/2015   Arthritis of knee, degenerative 06/30/2015   HLD (hyperlipidemia) 04/17/2013    Past Surgical History:  Procedure Laterality Date   ANAL FISSURECTOMY  ?2011   CESAREAN SECTION  1976; 1980; 1982   TOTAL KNEE ARTHROPLASTY Right 04/22/2013   TOTAL KNEE ARTHROPLASTY Right 04/22/2013   Procedure: TOTAL KNEE ARTHROPLASTY;  Surgeon: Jasmine Mesi, MD;  Location: [facility] OR;  Service: Orthopedics;  Laterality: Right;  Right Total Knee Arthroplasty    OB History   No obstetric history on file.      Home Medications    Prior to Admission medications   Medication Sig Start Date End Date Taking? Authorizing Provider  mupirocin cream (BACTROBAN) 2 % Apply 1 Application  topically 2 (two) times daily. 04/25/24  Yes Rosevelt Constable, Vitaliy Eisenhour A, NP  predniSONE (DELTASONE) 20 MG tablet Take 2 tablets (40 mg total) by mouth daily for 3 days. 04/25/24 04/28/24 Yes Eamonn Sermeno A, NP  triamcinolone  cream (KENALOG ) 0.1 % Apply 1 Application topically 2 (two) times daily. 04/25/24  Yes Levora Reas A, NP  aspirin  81 MG chewable tablet Chew 81 mg by mouth daily.    [provider]  Cholecalciferol  (VITAMIN D3) 400 UNITS CAPS Take 1 capsule by mouth daily.    [provider]  ferrous sulfate 325 (65 FE) MG tablet Take 325 mg by mouth daily with breakfast.    [provider]  Garlic 1000 MG CAPS Take 1 capsule by mouth daily.    [provider]  triamterene -hydrochlorothiazide  (MAXZIDE ) 75-50 MG per tablet Take 0.5 tablets by mouth daily.    [provider]    Family History Family History  Problem Relation Age of Onset   Hypertension Mother    Diabetes Mother    Alzheimer's disease Father    Cancer Father        Prostate   Breast cancer Neg Hx     Social History Social History   Tobacco Use   Smoking status: Never   Smokeless tobacco:  Never  Vaping Use   Vaping status: Never Used  Substance Use Topics   Alcohol use: No   Drug use: No     Allergies   Patient has no known allergies.   Review of Systems Review of Systems  Skin:  Positive for rash.   Per HPI  Physical Exam Triage Vital Signs ED Triage Vitals  Encounter Vitals Group     BP 04/25/24 1811 110/72     Girls Systolic BP Percentile --      Girls Diastolic BP Percentile --      Boys Systolic BP Percentile --      Boys Diastolic BP Percentile --      Pulse Rate 04/25/24 1811 66     Resp 04/25/24 1811 16     Temp 04/25/24 1811 98 F (36.7 C)     Temp Source 04/25/24 1811 Oral     SpO2 04/25/24 1811 98 %     Weight --      Height --      Head Circumference --      Peak Flow --      Pain Score 04/25/24 1810 5     Pain Loc --      Pain  Education --      Exclude from Growth Chart --    No data found.  Updated Vital Signs BP 110/72 (BP Location: Left Arm)   Pulse 66   Temp 98 F (36.7 C) (Oral)   Resp 16   SpO2 98%   Visual Acuity Right Eye Distance:   Left Eye Distance:   Bilateral Distance:    Right Eye Near:   Left Eye Near:    Bilateral Near:     Physical Exam Vitals and nursing note reviewed.  Constitutional:      General: She is awake. She is not in acute distress.    Appearance: Normal appearance. She is well-developed and well-groomed. She is not ill-appearing.   Skin:    Findings: Abrasion and rash present. Rash is papular.     Comments: Mild papular rash noted to anterior aspect of right upper arm.  There is some surrounding erythema and mild swelling noted to the right upper arm.  There is a small superficial abrasion to the center of the right upper arm as well.  No signs of infection at this time.   Neurological:     Mental Status: She is alert.   Psychiatric:        Behavior: Behavior is cooperative.      UC Treatments / Results  Labs (all labs ordered are listed, but only abnormal results are displayed) Labs Reviewed - No data to display  EKG   Radiology No results found.  Procedures Procedures (including critical care time)  Medications Ordered in UC Medications - No data to display  Initial Impression / Assessment and Plan / UC Course  I have reviewed the triage vital signs and the nursing notes.  Pertinent labs & imaging results that were available during my care of the patient were reviewed by me and considered in my medical decision making (see chart for details).     Patient is overall well-appearing.  Vitals are stable.  Mild papular rash noted to anterior aspect of right arm.  There is some surrounding erythema and mild swelling noted to the right upper arm.  There is a small superficial abrasion to the center of the right upper arm as well.  There are no signs  of infection at this time.  Prescribed prednisone burst to help with rash and swelling.  Prescribed triamcinolone  cream to apply to rash.  Prescribed mupirocin cream to apply to abrasions on arm.  Discussed over-the-counter antihistamine use.  Discussed proper wound care.  Discussed return precautions. Final Clinical Impressions(s) / UC Diagnoses   Final diagnoses:  Rash and nonspecific skin eruption  Abrasion     Discharge Instructions      Start taking 2 tablets of prednisone once daily for 3 days to help with rash and swelling. Apply triamcinolone  cream twice daily to the rash. Apply mupirocin cream twice daily to the open area on your arm for infection prevention.  Keep this area clean dry and covered. You can also take over-the-counter cetirizine  (Zyrtec ) once daily to help with itching. Return here if your symptoms persist or worsen.    ED Prescriptions     Medication Sig Dispense Auth. Provider   triamcinolone  cream (KENALOG ) 0.1 % Apply 1 Application topically 2 (two) times daily. 30 g Levora Reas A, NP   mupirocin cream (BACTROBAN) 2 % Apply 1 Application topically 2 (two) times daily. 15 g Levora Reas A, NP   predniSONE (DELTASONE) 20 MG tablet Take 2 tablets (40 mg total) by mouth daily for 3 days. 6 tablet Levora Reas A, NP      PDMP not reviewed this encounter.   Levora Reas A, NP 04/25/24 [phone]

## 2024-05-13 ENCOUNTER — Emergency Department (HOSPITAL_COMMUNITY)

## 2024-05-13 ENCOUNTER — Encounter (HOSPITAL_COMMUNITY): Payer: Self-pay | Admitting: *Deleted

## 2024-05-13 ENCOUNTER — Other Ambulatory Visit: Payer: Self-pay

## 2024-05-13 ENCOUNTER — Emergency Department (HOSPITAL_COMMUNITY): Admission: EM | Admit: 2024-05-13 | Discharge: 2024-05-13 | Disposition: A | Discharge status: Home / Self Care

## 2024-05-13 DIAGNOSIS — Z7982 Long term (current) use of aspirin: Secondary | ICD-10-CM | POA: Insufficient documentation

## 2024-05-13 DIAGNOSIS — M25512 Pain in left shoulder: Secondary | ICD-10-CM | POA: Insufficient documentation

## 2024-05-13 DIAGNOSIS — R0789 Other chest pain: Secondary | ICD-10-CM | POA: Diagnosis present

## 2024-05-13 LAB — BASIC METABOLIC PANEL WITH GFR
Anion gap: 11 (ref 5–15)
BUN: 17 mg/dL (ref 8–23)
CO2: 26 mmol/L (ref 22–32)
Calcium: 9.4 mg/dL (ref 8.9–10.3)
Chloride: 102 mmol/L (ref 98–111)
Creatinine, Ser: 0.99 mg/dL (ref 0.44–1.00)
GFR, Estimated: >60 mL/min (ref >60)
Glucose, Bld: 93 mg/dL (ref 70–99)
Potassium: 3.5 mmol/L (ref 3.5–5.1)
Sodium: 139 mmol/L (ref 135–145)

## 2024-05-13 LAB — CBC
HCT: 39.6 % (ref 36.0–46.0)
Hemoglobin: 13.1 g/dL (ref 12.0–15.0)
MCH: 30.3 pg (ref 26.0–34.0)
MCHC: 33.1 g/dL (ref 30.0–36.0)
MCV: 91.5 fL (ref 80.0–100.0)
Platelets: 189 K/uL (ref 150–400)
RBC: 4.33 MIL/uL (ref 3.87–5.11)
RDW: 13.7 % (ref 11.5–15.5)
WBC: 4.5 K/uL (ref 4.0–10.5)
nRBC: 0 % (ref 0.0–0.2)

## 2024-05-13 LAB — TROPONIN I (HIGH SENSITIVITY)
Troponin I (High Sensitivity): 2 ng/L (ref <18)
Troponin I (High Sensitivity): 2 ng/L (ref <18)

## 2024-05-13 NOTE — Discharge Instructions (Signed)
 You can take Tylenol  Motrin  as needed for pain.  Call your primary care doctor and make a close follow-up appointment with them for the next 1 to 2 weeks.  Return to the ER for any new or worsening symptoms.

## 2024-05-13 NOTE — ED Triage Notes (Signed)
 While driving to work noted left shoulder pain that moved to chest. No other symptoms noted. Pain comes and goes, none noted during triage.

## 2024-05-13 NOTE — ED Provider Notes (Signed)
 [place] EMERGENCY DEPARTMENT AT [facility] Provider Note   CSN: [id number] Arrival date & time: 05/13/24  9098     Patient presents with: Chest Pain and Shoulder Pain   Rhonda Wells is a 68 y.o. female.   68 year old female presents for evaluation of chest pain.  She states it started in her shoulder and radiated down to her chest.  Describes as a pressure.  States it is better than it was when she was in the car driving earlier.  Denies any nausea or shortness of breath or lightheadedness.  Denies any history of heart problems.  Denies any other symptoms or concerns at this time.   Chest Pain Associated symptoms: no abdominal pain, no back pain, no cough, no fever, no palpitations, no shortness of breath and no vomiting   Shoulder Pain Associated symptoms: no back pain and no fever        Prior to Admission medications   Medication Sig Start Date End Date Taking? Authorizing Provider  aspirin  81 MG chewable tablet Chew 81 mg by mouth daily.    [provider]  Cholecalciferol  (VITAMIN D3) 400 UNITS CAPS Take 1 capsule by mouth daily.    [provider]  ferrous sulfate 325 (65 FE) MG tablet Take 325 mg by mouth daily with breakfast.    [provider]  Garlic 1000 MG CAPS Take 1 capsule by mouth daily.    [provider]  mupirocin  cream (BACTROBAN ) 2 % Apply 1 Application topically 2 (two) times daily. 04/25/24   Johnie Flaming A, NP  triamcinolone  cream (KENALOG ) 0.1 % Apply 1 Application topically 2 (two) times daily. 04/25/24   Johnie Flaming A, NP  triamterene -hydrochlorothiazide  (MAXZIDE ) 75-50 MG per tablet Take 0.5 tablets by mouth daily.    [provider]    Allergies: Patient has no known allergies.    Review of Systems  Constitutional:  Negative for chills and fever.  HENT:  Negative for ear pain and sore throat.   Eyes:  Negative for pain and visual disturbance.  Respiratory:  Negative for cough and  shortness of breath.   Cardiovascular:  Positive for chest pain. Negative for palpitations.  Gastrointestinal:  Negative for abdominal pain and vomiting.  Genitourinary:  Negative for dysuria and hematuria.  Musculoskeletal:  Negative for arthralgias and back pain.  Skin:  Negative for color change and rash.  Neurological:  Negative for seizures and syncope.  All other systems reviewed and are negative.   Updated Vital Signs BP 110/69   Pulse (!) 52   Temp 97.7 F (36.5 C) (Oral)   Resp 14   Ht 5' 1 (1.549 m)   Wt 67.1 kg   SpO2 97%   BMI 27.96 kg/m   Physical Exam Vitals and nursing note reviewed.  Constitutional:      General: She is not in acute distress.    Appearance: She is well-developed. She is not ill-appearing.  HENT:     Head: Normocephalic and atraumatic.  Eyes:     Conjunctiva/sclera: Conjunctivae normal.  Cardiovascular:     Rate and Rhythm: Normal rate and regular rhythm.     Heart sounds: Normal heart sounds. No murmur heard. Pulmonary:     Effort: Pulmonary effort is normal. No tachypnea, accessory muscle usage or respiratory distress.     Breath sounds: Normal breath sounds. No stridor.  Abdominal:     Palpations: Abdomen is soft.     Tenderness: There is no abdominal tenderness.  Musculoskeletal:        General: No swelling.     Cervical back: Neck supple.  Skin:    General: Skin is warm and dry.     Capillary Refill: Capillary refill takes less than 2 seconds.  Neurological:     Mental Status: She is alert.  Psychiatric:        Mood and Affect: Mood normal.     (all labs ordered are listed, but only abnormal results are displayed) Labs Reviewed  BASIC METABOLIC PANEL WITH GFR  CBC  TROPONIN I (HIGH SENSITIVITY)  TROPONIN I (HIGH SENSITIVITY)    EKG: EKG Interpretation Date/Time:  Tuesday May 13 2024 09:23:18 EDT Ventricular Rate:  63 PR Interval:  135 QRS Duration:  93 QT Interval:  427 QTC Calculation: 438 R  Axis:   81  Text Interpretation: Sinus rhythm Right axis deviation No STEMI No significant change when compared to prior EKG from 06/18/2018 Confirmed by Gennaro Bouchard ([id number]) on 05/13/2024 9:25:31 AM  Radiology: [facility] Chest Port 1 View Result Date: 05/13/2024 CLINICAL DATA:  Chest pain. EXAM: PORTABLE CHEST 1 VIEW COMPARISON:  December 30, 2016. FINDINGS: The heart size and mediastinal contours are within normal limits. Hypoinflation of the lungs. Both lungs are clear. The visualized skeletal structures are unremarkable. IMPRESSION: No active disease. Electronically Signed   By: Lynwood Landy Raddle M.D.   On: 05/13/2024 10:12     Procedures   Medications Ordered in the ED - No data to display                                  Medical Decision Making Medical Decision Making Nursing notes are reviewed. Differential diagnosis for this patient would include but not limited to: ACS, anxiety, pneumothorax, pneumonia, musculoskeletal chest pain, GERD, other  Cardiac monitor interpretation: Sinus rhythm, no ectopy  Emergency Department Course:  Vital signs and pulse oximetry are reviewed, evaluated by myself and found to be within normal limits prior to final disposition. Findings of laboratory testing and medical imaging are discussed with patient and family that is available. Patient agrees with the medical care plan as follows:  Patient arrived for chest pain that she described as pressure but appeared well, with stable vital signs.  Doubt cardiac etiology at this time.  Workup is largely unremarkable with negative troponins EKG and chest x-ray.  Advised Tylenol  Motrin  as needed for pain.  She did not require pain medication while in the ER.  Advised close up with her primary care doctor and otherwise return to the ER for new or worsening symptoms.  She feels comfortable to plan to be discharged home.  All results and plan discussed with patient and family bedside.  Problems Addressed: Acute pain  of left shoulder: self-limited or minor problem Atypical chest pain: undiagnosed new problem with uncertain prognosis  Amount and/or Complexity of Data Reviewed External Data Reviewed: notes.    Details: Outpatient records reviewed and patient last seen last month for rash at urgent care Labs: ordered. Decision-making details documented in ED Course.    Details: Labs ordered and reviewed by me and fairly unremarkable.  Troponins are negative x 2 and CBC and BMP are within normal limits Radiology: ordered and independent interpretation performed. Decision-making details documented in ED Course.    Details: Chest x-ray ordered and interpreted independent of radiology and showed no acute abnormality of the chest ECG/medicine tests: ordered and independent interpretation  performed. Decision-making details documented in ED Course.    Details: Ordered and interpreted by me in the absence of cardiology and show sinus rhythm, no STEMI no acute change when compared to prior EKG  Risk OTC drugs.     Final diagnoses:  Atypical chest pain  Acute pain of left shoulder    ED Discharge Orders     None          Gennaro Duwaine CROME, DO 05/13/24 1407

## 2024-05-13 NOTE — Telephone Encounter (Signed)
 Copied from CRM [id number]. Topic: Clinical Concerns - High Priority Key Word >> May 13, 2024  8:12 AM Karna PEDLAR wrote: Rhonda Wells, Rhonda Wells is calling for clinical concerns (Ask: What symptoms are you calling about today, AND how long have you had these symptoms? Must Review HPKW list for symptoms) Document Name of Triage Nurse/BH Rep taking the call when applicable)   Include all details related to the request(s) below: Patient is having left arm pressure that radiates to left chest. Patient states the pressure is light.   Patient does have high blood pressure and has taken her medication today.   Patient has been transferred to [facility]/nurse  Confirm and type the Best Contact Number below:  Patient/caller contact number:     [phone]  or [phone]     [] Home  [x] Mobile  [] Work [] Other   [x] Okay to leave a voicemail   Medication List:  Current Outpatient Medications:  .  alpha tocopherol (VITAMIN E) 268 mg (400 unit) capsule, Take 400 Units by mouth Once Daily., Disp: , Rfl:  .  ascorbic acid (VITAMIN C) 1,000 mg tablet, Take 1,000 mg by mouth Once Daily., Disp: , Rfl:  .  aspirin  81 mg chewable tablet, Take 1 tablet by mouth Once Daily., Disp: , Rfl:  .  diclofenac (CATAFLAM) 50 mg tablet, Take 1 tablet (50 mg total) by mouth 2 (two) times a day as needed (pain)., Disp: 20 tablet, Rfl: 0 .  garlic 1,000 mg capsule, Take 1 capsule by mouth daily., Disp: , Rfl:  .  omega 3-dha-epa-fish oil (OMEGA 3) 1,000 mg capsule, Take 1 g by mouth Once Daily., Disp: , Rfl:  .  potassium chloride  (KLOR-CON ) 10 mEq ER tablet, Take 1 tablet (10 mEq total) by mouth daily. (Patient not taking: Reported on 02/07/2024), Disp: 7 tablet, Rfl: 0 .  rosuvastatin (CRESTOR) 20 mg tablet, TAKE 1 TABLET BY MOUTH EVERY DAY, Disp: 90 tablet, Rfl: 1 .  triamterene -hydroCHLOROthiazide  (MAXZIDE ) 75-50 mg per tablet, TAKE 1 TABLET BY MOUTH EVERY DAY, Disp: 90 tablet, Rfl: 1     Medication Request/Refills: Pharmacy  Information (if applicable)   [x] Not Applicable       []  Pharmacy listed  Send Medication Request to:                                                 [] Pharmacy not listed (added to pharmacy list in Epic) Send Medication Request to:      Listed Pharmacies: [id number] - [place], [place] - [address] - PHONE: [phone] - FAX: [phone]
